# Patient Record
Sex: Female | Born: 1985
Health system: Southern US, Community
[De-identification: ages and names within clinical notes are randomized; demographics above are authoritative.]

## PROBLEM LIST (undated history)

## (undated) DIAGNOSIS — E559 Vitamin D deficiency, unspecified: Secondary | ICD-10-CM

## (undated) DIAGNOSIS — N61 Mastitis without abscess: Secondary | ICD-10-CM

## (undated) DIAGNOSIS — K649 Unspecified hemorrhoids: Secondary | ICD-10-CM

## (undated) DIAGNOSIS — Z789 Other specified health status: Secondary | ICD-10-CM

## (undated) DIAGNOSIS — O24419 Gestational diabetes mellitus in pregnancy, unspecified control: Secondary | ICD-10-CM

## (undated) HISTORY — DX: Vitamin D deficiency, unspecified: E55.9

## (undated) HISTORY — DX: Mastitis without abscess: N61.0

## (undated) HISTORY — PX: WISDOM TOOTH EXTRACTION: SHX21

## (undated) HISTORY — DX: Unspecified hemorrhoids: K64.9

## (undated) HISTORY — PX: ROOT CANAL: SHX2363

## (undated) HISTORY — DX: Gestational diabetes mellitus in pregnancy, unspecified control: O24.419

## (undated) HISTORY — PX: OTHER SURGICAL HISTORY: SHX169

---
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
## 2006-02-27 ENCOUNTER — Encounter: Payer: Self-pay | Admitting: Internal Medicine

## 2016-09-11 ENCOUNTER — Encounter: Payer: Self-pay | Admitting: Internal Medicine

## 2017-11-11 ENCOUNTER — Other Ambulatory Visit (INDEPENDENT_AMBULATORY_CARE_PROVIDER_SITE_OTHER): Payer: No Typology Code available for payment source

## 2017-11-11 DIAGNOSIS — Z3201 Encounter for pregnancy test, result positive: Secondary | ICD-10-CM

## 2017-11-11 LAB — POCT URINE PREGNANCY: Preg Test, Ur: POSITIVE — AB

## 2017-11-11 NOTE — Progress Notes (Signed)
Jordan Brown presents today for UPT. She {gen pregnancy complaints missed menstrual cycle and positive home pregnancy test. No unusual complaints of abdominal pain at this time.   LMP: 09/21/2017    OBJECTIVE: Appears well, in no apparent distress.  OB History   None    Home UPT Result: positive  In-Office UPT result: positive  I have reviewed the patient's medical, obstetrical, social, and family histories, and medications.   ASSESSMENT: Positive pregnancy test  PLAN Prenatal care to be completed at [facility]. Will see patient starting at 10-12 weeks of pregnancy.

## 2017-11-12 ENCOUNTER — Other Ambulatory Visit: Payer: Self-pay

## 2017-12-09 ENCOUNTER — Encounter: Payer: Self-pay | Admitting: Obstetrics & Gynecology

## 2017-12-09 ENCOUNTER — Telehealth: Payer: Self-pay | Admitting: Obstetrics & Gynecology

## 2017-12-09 DIAGNOSIS — Z5189 Encounter for other specified aftercare: Principal | ICD-10-CM

## 2017-12-09 DIAGNOSIS — O039 Complete or unspecified spontaneous abortion without complication: Secondary | ICD-10-CM

## 2017-12-09 NOTE — Telephone Encounter (Signed)
Faculty Practice OB/GYN Physician Phone Call Documentation  I received a call from Dr. [name], with report of recent miscarriage. She was asking about follow up studies such as ultrasound.  She reports passing tissue three days ago, continues to have period-like bleeding. Had cramping that lessened significantly after passage of the tissue. No other symptoms.  Patient was reassured that her story was consistent with likely completed miscarriage. Given her concerns about possible retained products, ultrasound was ordered, she will call to schedule this at [facility] where she works.  Her office new obstetric appointment scheduled on 12/19/17 was cancelled as per her request, she is up-to-date on pap smears and she will call to schedule follow up visit later (considering getting thrombophilia evaluation etc).  Patient was given adequate support, discussed availability of counselors, [facility] etc which she declined at this point. She reports having adequate support from her family.  She was told to call for any worsening symptoms or other gynecologic concerns.     Verita Schneiders, MD, [place] for [employer], [place]

## 2017-12-11 ENCOUNTER — Other Ambulatory Visit: Payer: Self-pay

## 2017-12-13 ENCOUNTER — Ambulatory Visit
Admission: RE | Admit: 2017-12-13 | Discharge: 2017-12-13 | Disposition: A | Discharge status: Home / Self Care | Payer: No Typology Code available for payment source | Source: Ambulatory Visit | Attending: Obstetrics & Gynecology | Admitting: Obstetrics & Gynecology

## 2017-12-13 DIAGNOSIS — Z5189 Encounter for other specified aftercare: Secondary | ICD-10-CM | POA: Insufficient documentation

## 2017-12-13 DIAGNOSIS — O039 Complete or unspecified spontaneous abortion without complication: Secondary | ICD-10-CM | POA: Diagnosis not present

## 2017-12-13 DIAGNOSIS — D259 Leiomyoma of uterus, unspecified: Secondary | ICD-10-CM | POA: Insufficient documentation

## 2017-12-19 ENCOUNTER — Encounter: Payer: Self-pay | Admitting: Obstetrics & Gynecology

## 2018-07-16 ENCOUNTER — Ambulatory Visit: Payer: No Typology Code available for payment source | Admitting: Internal Medicine

## 2018-07-17 ENCOUNTER — Ambulatory Visit (INDEPENDENT_AMBULATORY_CARE_PROVIDER_SITE_OTHER): Payer: No Typology Code available for payment source | Admitting: Obstetrics and Gynecology

## 2018-07-17 ENCOUNTER — Encounter: Payer: Self-pay | Admitting: Obstetrics and Gynecology

## 2018-07-17 ENCOUNTER — Other Ambulatory Visit: Payer: Self-pay

## 2018-07-17 DIAGNOSIS — Z319 Encounter for procreative management, unspecified: Secondary | ICD-10-CM

## 2018-07-17 DIAGNOSIS — Z9189 Other specified personal risk factors, not elsewhere classified: Secondary | ICD-10-CM

## 2018-07-17 NOTE — Progress Notes (Signed)
Patient reports have irregular bleeding for two months now. Patient does not want to be on birth control at this time.  LMP:07/12/2018  Last pap 2019- normal

## 2018-07-17 NOTE — Progress Notes (Signed)
Obstetrics and Gynecology New Patient Evaluation   TELEHEALTH VIRTUAL GYNECOLOGY VISIT ENCOUNTER NOTE  I connected with Jordan Brown on 07/17/18 at  1:15 PM EDT by telephone at home and verified that I am speaking with the correct person using two identifiers.   I discussed the limitations, risks, security and privacy concerns of performing an evaluation and management service by telephone and the availability of in person appointments. I also discussed with the patient that there may be a patient responsible charge related to this service. The patient expressed understanding and agreed to proceed.  Appointment Date: 07/18/2018  OBGYN Clinic: [facility]  Primary Care Provider: No primary care provider on file.  Referring Provider: Self  Chief Complaint:  Chief Complaint  Patient presents with  . Menstrual Problem    History of Present Illness: Jordan Brown is a 33 y.o. [place] G1P0010 (07/12/2018), seen for the above chief complaint. Her past medical history is significant for nothing.  Patient had an early SAB in September 2019 and since that time she's noted that her periods seem to be increasing in interval between (33-40 days). Her length of periods are still the same: 5-7 days, not particularly heavy or painful. Her LMP was 4/25 and her one prior was on 3/15 and used to be consistently approximately 30 days. Her and her husband are actively trying to concieve. She states she took an OPK and it showed an [place] surge on 4/8; that was the only time she's used an OPK. She has taken home UPTs and they have been negative. She is also using an app for timed intercourse.   Her husband is 43 y/o, doesn't drink or smoke, has no medical or surgical history, only takes finasteride for alopecia and has no children.   Review of Systems: as noted in the History of Present Illness.   Past Medical History:  History reviewed. No pertinent past medical  history.  Past Surgical History:  History reviewed. No pertinent surgical history.  Past Obstetrical History:  OB History  Gravida Para Term Preterm AB Living  1       1 0  SAB TAB Ectopic Multiple Live Births  1       0    # Outcome Date GA Lbr Len/2nd Weight Sex Delivery Anes PTL Lv  1 SAB             Past Gynecological History: As per HPI. History of Pap Smear(s): Yes.   Last pap 2018, which was negative and no h/o abnormal paps  Social History:  Social History   Socioeconomic History  . Marital status: Married    Spouse name: Not on file  . Number of children: Not on file  . Years of education: Not on file  . Highest education level: Not on file  Occupational History  . Not on file  Social Needs  . Financial resource strain: Not on file  . Food insecurity:    Worry: Not on file    Inability: Not on file  . Transportation needs:    Medical: Not on file    Non-medical: Not on file  Tobacco Use  . Smoking status: Not on file  Substance and Sexual Activity  . Alcohol use: Never    Frequency: Never  . Drug use: Never  . Sexual activity: Not on file  Lifestyle  . Physical activity:    Days per week: Not on file    Minutes per session: Not on file  .  Stress: Not on file  Relationships  . Social connections:    Talks on phone: Not on file    Gets together: Not on file    Attends religious service: Not on file    Active member of club or organization: Not on file    Attends meetings of clubs or organizations: Not on file    Relationship status: Not on file  . Intimate partner violence:    Fear of current or ex partner: Not on file    Emotionally abused: Not on file    Physically abused: Not on file    Forced sexual activity: Not on file  Other Topics Concern  . Not on file  Social History Narrative   Patient is a GI physician     Family History: She denies any bleeding or blood clotting disorders.    Medications Jordan Brown had no medications  administered during this visit. No current outpatient medications on file.   No current facility-administered medications for this visit.     Allergies Patient has no allergy information on record.   Physical Exam:  There were no vitals taken for this visit. There is no height or weight on file to calculate BMI. General appearance: Well nourished, well developed female in no acute distress.  Neuro/Psych:  Normal mood and affect.   Laboratory: none  Radiology:  CLINICAL DATA:  Followup after miscarriage.  EXAM: OBSTETRIC <14 WK [place] AND TRANSVAGINAL OB US  TECHNIQUE: Both transabdominal and transvaginal ultrasound examinations were performed for complete evaluation of the gestation as well as the maternal uterus, adnexal regions, and pelvic cul-de-sac. Transvaginal technique was performed to assess early pregnancy.  COMPARISON:  None.  FINDINGS: Intrauterine gestational sac: None  Yolk sac:  None  Embryo:  None  Cardiac Activity: None  Heart Rate: Absent bpm  Subchorionic hemorrhage:  None applicable  Maternal uterus/adnexae:  Uterus: 6.5 x 4.8 x 5.8 centimeters. Posterior fibroid is 2.3 x 1.8 x 1.8 centimeters. Anterior fibroid is 0.7 x 0.8 x 0.7 centimeters.  Endometrium: 9.7 millimeters. Minimally heterogeneous with no evidence for retained products of conception.  RIGHT ovary: 4.0 x 1.6 x 2.9 centimeters.  Normal in appearance.  LEFT ovary: 4.0 x 2.2 x 2.8 centimeters. Small corpus luteum cyst is 2.2 centimeters.  IMPRESSION: 1. No evidence for intrauterine or adnexal pregnancy. 2. Normal appearance of the endometrial stripe, 9.7 millimeters in thickness. 3. Uterine fibroids, largest measuring 2.3 centimeters.   Electronically Signed   By: Nolon Nations M.D.   On: 12/13/2017 15:21  Assessment: pt stable  Plan:  *Infertility Counseling  I told her that given her age and history of trying for several months that I recommend  evaluation by REI. I also told her that luteal phase of a cycle is constant at 14 days and it's the follicular phase that can vary and causes the different lengths in a cycle. I told her that typically you would do intercourse every other day for a week starting on cycle day 10 but that can be difficult to time with such varying lengths of her cycle. I told her that I recommend counting 10 days from the 1st day of regular flow and having intercourse every other day. I also told her to continue to use the OPK and to use this time when they can stop having timed intercourse, b/c she should expect to see ovulation within about 24-72 hours of the +OPK results. I told her that it is reassuring that the prior  OPK test results showed that she is ovulating. I also told her that I'm not aware of any negative effects, in terms of fertility, with finasteride  I also told her that we usually get a cycle day 3 baseline u/s but that can be done with REI since nothing impressive on her prior u/s and they will likely want to do an HSG. I reviewed the images and the fibroids appear to be intramural and that fibroids that small would only really impact her fertility if they were submucosal but even that usually causes issues with recurrent SAB and not getting pregnant.   Recommendations: 1) Refer to REI. Pt amenable to this and will left [place] know where she'd like to be referred; she lives in [place] 2) I recommend her husband get a semen analysis at a fertility center ([facility] in [place], [place]) 3) Recommend getting cycle day 3 LH, FSH, estradiol, AMH levels. I will also do a TSH and RPR 4) take a women's once a day or folic acid daily 5) continue with timed intercourse, OPK use. Recommend increasing timed intercourse length.   RTC PRN  I provided 30 minutes of non-face-to-face time during this encounter. The visit was done via a Webex visit.   Durene Romans MD Attending Center for [employer])

## 2018-07-18 ENCOUNTER — Encounter: Payer: Self-pay | Admitting: Obstetrics and Gynecology

## 2018-07-18 DIAGNOSIS — Z319 Encounter for procreative management, unspecified: Secondary | ICD-10-CM | POA: Insufficient documentation

## 2018-07-28 ENCOUNTER — Ambulatory Visit: Payer: No Typology Code available for payment source | Admitting: Obstetrics & Gynecology

## 2018-10-06 ENCOUNTER — Encounter: Payer: No Typology Code available for payment source | Admitting: Obstetrics & Gynecology

## 2018-10-09 ENCOUNTER — Other Ambulatory Visit: Payer: Self-pay

## 2018-10-09 ENCOUNTER — Encounter: Payer: Self-pay | Admitting: Obstetrics & Gynecology

## 2018-10-09 ENCOUNTER — Ambulatory Visit (INDEPENDENT_AMBULATORY_CARE_PROVIDER_SITE_OTHER): Payer: No Typology Code available for payment source | Admitting: Obstetrics & Gynecology

## 2018-10-09 ENCOUNTER — Encounter: Payer: No Typology Code available for payment source | Admitting: Obstetrics & Gynecology

## 2018-10-09 VITALS — BP 125/75 | HR 82 | Ht 65.0 in | Wt 130.0 lb

## 2018-10-09 DIAGNOSIS — Z3A12 12 weeks gestation of pregnancy: Secondary | ICD-10-CM

## 2018-10-09 DIAGNOSIS — Z3689 Encounter for other specified antenatal screening: Secondary | ICD-10-CM

## 2018-10-09 DIAGNOSIS — Z113 Encounter for screening for infections with a predominantly sexual mode of transmission: Secondary | ICD-10-CM | POA: Diagnosis not present

## 2018-10-09 DIAGNOSIS — Z3490 Encounter for supervision of normal pregnancy, unspecified, unspecified trimester: Secondary | ICD-10-CM | POA: Insufficient documentation

## 2018-10-09 DIAGNOSIS — Z3401 Encounter for supervision of normal first pregnancy, first trimester: Secondary | ICD-10-CM

## 2018-10-09 DIAGNOSIS — O0990 Supervision of high risk pregnancy, unspecified, unspecified trimester: Secondary | ICD-10-CM | POA: Insufficient documentation

## 2018-10-09 NOTE — Progress Notes (Signed)
History:   Jordan Brown is a 33 y.o. G2P0010 at [redacted]w[redacted]d by LMP, early ultrasound being seen today for her first obstetrical visit.  Her obstetrical history is significant for history of recent miscarriage in 11/2017 and some infertility issues. Accompanied by her husband. Patient does intend to breast feed. Pregnancy history fully reviewed.  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  Patient reports no complaints.      HISTORY: OB History  Gravida Para Term Preterm AB Living  2 0 0 0 1 0  SAB TAB Ectopic Multiple Live Births  1 0 0 0 0    # Outcome Date GA Lbr Len/2nd Weight Sex Delivery Anes PTL Lv  2 Current           1 SAB             Last pap smear was done 2018 and was normal  Past Medical History:  Diagnosis Date  . Medical history non-contributory    Past Surgical History:  Procedure Laterality Date  . NO PAST SURGERIES     History reviewed. No pertinent family history. Social History   Tobacco Use  . Smoking status: Never Smoker  . Smokeless tobacco: Never Used  Substance Use Topics  . Alcohol use: Never    Frequency: Never  . Drug use: Never   No Known Allergies Current Outpatient Medications on File Prior to Visit  Medication Sig Dispense Refill  . prenatal vitamin w/FE, FA (PRENATAL 1 + 1) 27-1 MG TABS tablet Take 1 tablet by mouth daily at 12 noon.     No current facility-administered medications on file prior to visit.     Review of Systems Pertinent items noted in HPI and remainder of comprehensive ROS otherwise negative. Physical Exam:   Vitals:   10/09/18 1519 10/09/18 1520  BP: 125/75   Pulse: 82   Weight: 130 lb (59 kg)   Height:  5\' 5"  (1.651 m)   Fetal Heart Rate (bpm): 148bpm**Bedside Ultrasound for FHR check: Patient informed that the ultrasound is considered a limited obstetric ultrasound and is not intended to be a complete ultrasound exam.  Patient also informed that the ultrasound is not being completed with the intent of  assessing for fetal or placental anomalies or any pelvic abnormalities.  Explained that the purpose of today's ultrasound is to assess for fetal heart rate.  Patient acknowledges the purpose of the exam and the limitations of the study. General: well-developed, well-nourished female in no acute distress  Breasts:  normal appearance, no masses or tenderness bilaterally  Skin: normal coloration and turgor, no rashes  Neurologic: oriented, normal, negative, normal mood  Extremities: normal strength, tone, and muscle mass, ROM of all joints is normal  HEENT PERRLA, extraocular movement intact and sclera clear, anicteric  Mouth/Teeth mucous membranes moist, pharynx normal without lesions and dental hygiene good  Neck supple and no masses  Cardiovascular: regular rate and rhythm  Respiratory:  no respiratory distress, normal breath sounds  Abdomen: soft, non-tender; bowel sounds normal; no masses,  no organomegaly  Pelvic: deferred     Assessment:    Pregnancy: G2P0010 Patient Active Problem List   Diagnosis Date Noted  . Supervision of normal pregnancy 10/09/2018     Plan:    1. Encounter for supervision of normal first pregnancy in first trimester 2. Encounter for fetal anatomic survey - Obstetric Panel, Including HIV - Genetic Screening - Culture, OB Urine - GC/Chlamydia probe amp ([place])not at [facility] -  Enroll Patient in Babyscripts - Babyscripts Schedule Optimization - Urinalysis, Routine w reflex microscopic - Comprehensive metabolic panel - TSH - [place] MFM OB COMP + 14 WK; Future Initial labs drawn. Continue prenatal vitamins. Genetic Screening discussed, NIPS: ordered. Ultrasound discussed; fetal anatomic survey: ordered. Problem list reviewed and updated. The nature of [place] with multiple MDs and other Advanced Practice Providers was explained to patient; also emphasized that residents, students are part of our team. Routine  obstetric precautions reviewed. Return in about 4 weeks (around 11/06/2018) for Virtual OB Visit.     Verita Schneiders, MD, [place] for [employer], [place]

## 2018-10-09 NOTE — Patient Instructions (Signed)
First Trimester of Pregnancy The first trimester of pregnancy is from week 1 until the end of week 13 (months 1 through 3). A week after a sperm fertilizes an egg, the egg will implant on the wall of the uterus. This embryo will begin to develop into a baby. Genes from you and your partner will form the baby. The female genes will determine whether the baby will be a boy or a girl. At 6-8 weeks, the eyes and face will be formed, and the heartbeat can be seen on ultrasound. At the end of 12 weeks, all the baby's organs will be formed. Now that you are pregnant, you will want to do everything you can to have a healthy baby. Two of the most important things are to get good prenatal care and to follow your health care provider's instructions. Prenatal care is all the medical care you receive before the baby's birth. This care will help prevent, find, and treat any problems during the pregnancy and childbirth. Body changes during your first trimester Your body goes through many changes during pregnancy. The changes vary from woman to woman.  You may gain or lose a couple of pounds at first.  You may feel sick to your stomach (nauseous) and you may throw up (vomit). If the vomiting is uncontrollable, call your health care provider.  You may tire easily.  You may develop headaches that can be relieved by medicines. All medicines should be approved by your health care provider.  You may urinate more often. Painful urination may mean you have a bladder infection.  You may develop heartburn as a result of your pregnancy.  You may develop constipation because certain hormones are causing the muscles that push stool through your intestines to slow down.  You may develop hemorrhoids or swollen veins (varicose veins).  Your breasts may begin to grow larger and become tender. Your nipples may stick out more, and the tissue that surrounds them ([place]ola) may become darker.  Your gums may bleed and may be  sensitive to brushing and flossing.  Dark spots or blotches (chloasma, mask of pregnancy) may develop on your face. This will likely fade after the baby is born.  Your menstrual periods will stop.  You may have a loss of appetite.  You may develop cravings for certain kinds of food.  You may have changes in your emotions from day to day, such as being excited to be pregnant or being concerned that something may go wrong with the pregnancy and baby.  You may have more vivid and strange dreams.  You may have changes in your hair. These can include thickening of your hair, rapid growth, and changes in texture. Some women also have hair loss during or after pregnancy, or hair that feels dry or thin. Your hair will most likely return to normal after your baby is born. What to expect at prenatal visits During a routine prenatal visit:  You will be weighed to make sure you and the baby are growing normally.  Your blood pressure will be taken.  Your abdomen will be measured to track your baby's growth.  The fetal heartbeat will be listened to between weeks 10 and 14 of your pregnancy.  Test results from any previous visits will be discussed. Your health care provider may ask you:  How you are feeling.  If you are feeling the baby move.  If you have had any abnormal symptoms, such as leaking fluid, bleeding, severe headaches, or abdominal   cramping.  If you are using any tobacco products, including cigarettes, chewing tobacco, and electronic cigarettes.  If you have any questions. Other tests that may be performed during your first trimester include:  Blood tests to find your blood type and to check for the presence of any previous infections. The tests will also be used to check for low iron levels (anemia) and protein on red blood cells (Rh antibodies). Depending on your risk factors, or if you previously had diabetes during pregnancy, you may have tests to check for high blood sugar  that affects pregnant women (gestational diabetes).  Urine tests to check for infections, diabetes, or protein in the urine.  An ultrasound to confirm the proper growth and development of the baby.  Fetal screens for spinal cord problems (spina bifida) and Down syndrome.  HIV (human immunodeficiency virus) testing. Routine prenatal testing includes screening for HIV, unless you choose not to have this test.  You may need other tests to make sure you and the baby are doing well. Follow these instructions at home: Medicines  Follow your health care provider's instructions regarding medicine use. Specific medicines may be either safe or unsafe to take during pregnancy.  Take a prenatal vitamin that contains at least 600 micrograms (mcg) of folic acid.  If you develop constipation, try taking a stool softener if your health care provider approves. Eating and drinking   Eat a balanced diet that includes fresh fruits and vegetables, whole grains, good sources of protein such as meat, eggs, or tofu, and low-fat dairy. Your health care provider will help you determine the amount of weight gain that is right for you.  Avoid raw meat and uncooked cheese. These carry germs that can cause birth defects in the baby.  Eating four or five small meals rather than three large meals a day may help relieve nausea and vomiting. If you start to feel nauseous, eating a few soda crackers can be helpful. Drinking liquids between meals, instead of during meals, also seems to help ease nausea and vomiting.  Limit foods that are high in fat and processed sugars, such as fried and sweet foods.  To prevent constipation: ? Eat foods that are high in fiber, such as fresh fruits and vegetables, whole grains, and beans. ? Drink enough fluid to keep your urine clear or pale yellow. Activity  Exercise only as directed by your health care provider. Most women can continue their usual exercise routine during  pregnancy. Try to exercise for 30 minutes at least 5 days a week. Exercising will help you: ? Control your weight. ? Stay in shape. ? Be prepared for labor and delivery.  Experiencing pain or cramping in the lower abdomen or lower back is a good sign that you should stop exercising. Check with your health care provider before continuing with normal exercises.  Try to avoid standing for long periods of time. Move your legs often if you must stand in one place for a long time.  Avoid heavy lifting.  Wear low-heeled shoes and practice good posture.  You may continue to have sex unless your health care provider tells you not to. Relieving pain and discomfort  Wear a good support bra to relieve breast tenderness.  Take warm sitz baths to soothe any pain or discomfort caused by hemorrhoids. Use hemorrhoid cream if your health care provider approves.  Rest with your legs elevated if you have leg cramps or low back pain.  If you develop varicose veins in   your legs, wear support hose. Elevate your feet for 15 minutes, 3-4 times a day. Limit salt in your diet. Prenatal care  Schedule your prenatal visits by the twelfth week of pregnancy. They are usually scheduled monthly at first, then more often in the last 2 months before delivery.  Write down your questions. Take them to your prenatal visits.  Keep all your prenatal visits as told by your health care provider. This is important. Safety  Wear your seat belt at all times when driving.  Make a list of emergency phone numbers, including numbers for family, friends, the hospital, and police and fire departments. General instructions  Ask your health care provider for a referral to a local prenatal education class. Begin classes no later than the beginning of month 6 of your pregnancy.  Ask for help if you have counseling or nutritional needs during pregnancy. Your health care provider can offer advice or refer you to specialists for help  with various needs.  Do not use hot tubs, steam rooms, or saunas.  Do not douche or use tampons or scented sanitary pads.  Do not cross your legs for long periods of time.  Avoid cat litter boxes and soil used by cats. These carry germs that can cause birth defects in the baby and possibly loss of the fetus by miscarriage or stillbirth.  Avoid all smoking, herbs, alcohol, and medicines not prescribed by your health care provider. Chemicals in these products affect the formation and growth of the baby.  Do not use any products that contain nicotine or tobacco, such as cigarettes and e-cigarettes. If you need help quitting, ask your health care provider. You may receive counseling support and other resources to help you quit.  Schedule a dentist appointment. At home, brush your teeth with a soft toothbrush and be gentle when you floss. Contact a health care provider if:  You have dizziness.  You have mild pelvic cramps, pelvic pressure, or nagging pain in the abdominal area.  You have persistent nausea, vomiting, or diarrhea.  You have a bad smelling vaginal discharge.  You have pain when you urinate.  You notice increased swelling in your face, hands, legs, or ankles.  You are exposed to fifth disease or chickenpox.  You are exposed to German measles (rubella) and have never had it. Get help right away if:  You have a fever.  You are leaking fluid from your vagina.  You have spotting or bleeding from your vagina.  You have severe abdominal cramping or pain.  You have rapid weight gain or loss.  You vomit blood or material that looks like coffee grounds.  You develop a severe headache.  You have shortness of breath.  You have any kind of trauma, such as from a fall or a car accident. Summary  The first trimester of pregnancy is from week 1 until the end of week 13 (months 1 through 3).  Your body goes through many changes during pregnancy. The changes vary from  woman to woman.  You will have routine prenatal visits. During those visits, your health care provider will examine you, discuss any test results you may have, and talk with you about how you are feeling. This information is not intended to replace advice given to you by your health care provider. Make sure you discuss any questions you have with your health care provider. Document Released: 02/27/2001 Document Revised: 02/15/2017 Document Reviewed: 02/15/2016 Elsevier Patient Education  2020 [employer]  

## 2018-10-10 LAB — COMPREHENSIVE METABOLIC PANEL
ALT: 18 IU/L (ref 0–32)
AST: 19 IU/L (ref 0–40)
Albumin/Globulin Ratio: 2 (ref 1.2–2.2)
Albumin: 4.8 g/dL (ref 3.8–4.8)
Alkaline Phosphatase: 33 IU/L — ABNORMAL LOW (ref 39–117)
BUN/Creatinine Ratio: 15 (ref 9–23)
BUN: 9 mg/dL (ref 6–20)
Bilirubin Total: <0.2 mg/dL (ref 0.0–1.2)
CO2: 22 mmol/L (ref 20–29)
Calcium: 10 mg/dL (ref 8.7–10.2)
Chloride: 99 mmol/L (ref 96–106)
Creatinine, Ser: 0.62 mg/dL (ref 0.57–1.00)
GFR calc Af Amer: 138 mL/min/{1.73_m2} (ref >59)
GFR calc non Af Amer: 120 mL/min/{1.73_m2} (ref >59)
Globulin, Total: 2.4 g/dL (ref 1.5–4.5)
Glucose: 72 mg/dL (ref 65–99)
Potassium: 4.3 mmol/L (ref 3.5–5.2)
Sodium: 136 mmol/L (ref 134–144)
Total Protein: 7.2 g/dL (ref 6.0–8.5)

## 2018-10-10 LAB — OBSTETRIC PANEL, INCLUDING HIV
Antibody Screen: NEGATIVE
Basophils Absolute: 0 10*3/uL (ref 0.0–0.2)
Basos: 0 %
EOS (ABSOLUTE): 0.1 10*3/uL (ref 0.0–0.4)
Eos: 1 %
HIV Screen 4th Generation wRfx: NONREACTIVE
Hematocrit: 37.3 % (ref 34.0–46.6)
Hemoglobin: 12.4 g/dL (ref 11.1–15.9)
Hepatitis B Surface Ag: NEGATIVE
Immature Grans (Abs): 0 10*3/uL (ref 0.0–0.1)
Immature Granulocytes: 0 %
Lymphocytes Absolute: 2.3 10*3/uL (ref 0.7–3.1)
Lymphs: 28 %
MCH: 30 pg (ref 26.6–33.0)
MCHC: 33.2 g/dL (ref 31.5–35.7)
MCV: 90 fL (ref 79–97)
Monocytes Absolute: 0.4 10*3/uL (ref 0.1–0.9)
Monocytes: 5 %
Neutrophils Absolute: 5.4 10*3/uL (ref 1.4–7.0)
Neutrophils: 66 %
Platelets: 210 10*3/uL (ref 150–450)
RBC: 4.13 x10E6/uL (ref 3.77–5.28)
RDW: 12.2 % (ref 11.7–15.4)
RPR Ser Ql: NONREACTIVE
Rh Factor: POSITIVE
Rubella Antibodies, IGG: 4.33 index (ref >0.99)
WBC: 8.2 10*3/uL (ref 3.4–10.8)

## 2018-10-10 LAB — URINALYSIS, ROUTINE W REFLEX MICROSCOPIC
Bilirubin, UA: NEGATIVE
Glucose, UA: NEGATIVE
Ketones, UA: NEGATIVE
Leukocytes,UA: NEGATIVE
Nitrite, UA: NEGATIVE
Protein,UA: NEGATIVE
RBC, UA: NEGATIVE
Specific Gravity, UA: 1.01 (ref 1.005–1.030)
Urobilinogen, Ur: 0.2 mg/dL (ref 0.2–1.0)
pH, UA: 6 (ref 5.0–7.5)

## 2018-10-10 LAB — TSH: TSH: 1.14 u[IU]/mL (ref 0.450–4.500)

## 2018-10-11 LAB — GC/CHLAMYDIA PROBE AMP (~~LOC~~) NOT AT ARMC
Chlamydia: NEGATIVE
Neisseria Gonorrhea: NEGATIVE

## 2018-10-12 LAB — URINE CULTURE, OB REFLEX

## 2018-10-12 LAB — CULTURE, OB URINE

## 2018-10-13 ENCOUNTER — Encounter: Payer: Self-pay | Admitting: Obstetrics & Gynecology

## 2018-10-13 DIAGNOSIS — O99820 Streptococcus B carrier state complicating pregnancy: Secondary | ICD-10-CM | POA: Insufficient documentation

## 2018-10-15 ENCOUNTER — Other Ambulatory Visit: Payer: Self-pay

## 2018-10-15 ENCOUNTER — Telehealth: Payer: Self-pay | Admitting: *Deleted

## 2018-10-15 ENCOUNTER — Other Ambulatory Visit: Payer: No Typology Code available for payment source

## 2018-10-15 ENCOUNTER — Encounter: Payer: Self-pay | Admitting: *Deleted

## 2018-10-15 NOTE — Telephone Encounter (Signed)
Pt informed of panorama results  ?

## 2018-10-29 ENCOUNTER — Telehealth: Payer: Self-pay | Admitting: *Deleted

## 2018-10-29 ENCOUNTER — Encounter: Payer: Self-pay | Admitting: *Deleted

## 2018-10-29 NOTE — Telephone Encounter (Signed)
Pt informed of carrier screen results.

## 2018-11-06 ENCOUNTER — Telehealth: Payer: No Typology Code available for payment source | Admitting: Family Medicine

## 2018-11-13 ENCOUNTER — Telehealth: Payer: No Typology Code available for payment source | Admitting: Family Medicine

## 2018-11-28 ENCOUNTER — Other Ambulatory Visit: Payer: Self-pay

## 2018-11-28 ENCOUNTER — Ambulatory Visit (HOSPITAL_COMMUNITY)
Admission: RE | Admit: 2018-11-28 | Discharge: 2018-11-28 | Disposition: A | Discharge status: Home / Self Care | Payer: No Typology Code available for payment source | Source: Ambulatory Visit | Attending: Obstetrics and Gynecology | Admitting: Obstetrics and Gynecology

## 2018-11-28 DIAGNOSIS — Z363 Encounter for antenatal screening for malformations: Secondary | ICD-10-CM

## 2018-11-28 DIAGNOSIS — Z3689 Encounter for other specified antenatal screening: Secondary | ICD-10-CM

## 2018-11-28 DIAGNOSIS — Z3401 Encounter for supervision of normal first pregnancy, first trimester: Secondary | ICD-10-CM

## 2018-11-28 DIAGNOSIS — Z3A19 19 weeks gestation of pregnancy: Secondary | ICD-10-CM | POA: Diagnosis not present

## 2018-11-29 ENCOUNTER — Encounter: Payer: Self-pay | Admitting: Obstetrics & Gynecology

## 2018-11-29 DIAGNOSIS — O358XX0 Maternal care for other (suspected) fetal abnormality and damage, not applicable or unspecified: Secondary | ICD-10-CM | POA: Insufficient documentation

## 2018-11-29 DIAGNOSIS — O35EXX0 Maternal care for other (suspected) fetal abnormality and damage, fetal genitourinary anomalies, not applicable or unspecified: Secondary | ICD-10-CM | POA: Insufficient documentation

## 2018-12-01 ENCOUNTER — Telehealth (INDEPENDENT_AMBULATORY_CARE_PROVIDER_SITE_OTHER): Payer: No Typology Code available for payment source | Admitting: Obstetrics & Gynecology

## 2018-12-01 ENCOUNTER — Other Ambulatory Visit (HOSPITAL_COMMUNITY): Payer: Self-pay | Admitting: *Deleted

## 2018-12-01 ENCOUNTER — Telehealth: Payer: No Typology Code available for payment source | Admitting: Obstetrics & Gynecology

## 2018-12-01 ENCOUNTER — Other Ambulatory Visit: Payer: Self-pay

## 2018-12-01 DIAGNOSIS — O358XX0 Maternal care for other (suspected) fetal abnormality and damage, not applicable or unspecified: Secondary | ICD-10-CM

## 2018-12-01 DIAGNOSIS — Z3400 Encounter for supervision of normal first pregnancy, unspecified trimester: Secondary | ICD-10-CM

## 2018-12-01 DIAGNOSIS — O99820 Streptococcus B carrier state complicating pregnancy: Secondary | ICD-10-CM

## 2018-12-01 DIAGNOSIS — O35EXX0 Maternal care for other (suspected) fetal abnormality and damage, fetal genitourinary anomalies, not applicable or unspecified: Secondary | ICD-10-CM

## 2018-12-01 DIAGNOSIS — Z3A20 20 weeks gestation of pregnancy: Secondary | ICD-10-CM

## 2018-12-01 NOTE — Progress Notes (Signed)
I connected with  Jordan Brown on 12/01/18 at  4:15 PM EDT by telephone and verified that I am speaking with the correct person using two identifiers.   I discussed the limitations, risks, security and privacy concerns of performing an evaluation and management service by telephone and the availability of in person appointments. I also discussed with the patient that there may be a patient responsible charge related to this service. The patient expressed understanding and agreed to proceed.  Crosby Oyster, RN 12/01/2018  4:17 PM

## 2018-12-01 NOTE — Progress Notes (Signed)
TELEHEALTH OBSTETRICS PRENATAL VIRTUAL VIDEO VISIT ENCOUNTER NOTE  Provider location: [facility] [place] [facility]   I connected with Jordan Brown on 12/01/18 at  4:15 PM EDT by MyChart Video Encounter at home and verified that I am speaking with the correct person using two identifiers.   I discussed the limitations, risks, security and privacy concerns of performing an evaluation and management service virtually and the availability of in person appointments. I also discussed with the patient that there may be a patient responsible charge related to this service. The patient expressed understanding and agreed to proceed. Subjective:  Jordan Brown is a 33 y.o. G2P0010 at [redacted]w[redacted]d being seen today for ongoing prenatal care.  She is currently monitored for the following issues for this low-risk pregnancy and has Supervision of normal pregnancy; Group B streptococcal carriage in urine complicating pregnancy; and Bilateral mild fetal urinary tract dilation, antepartum on their problem list.  Patient reports no complaints.  Contractions: Not present. Vag. Bleeding: None.   . Denies any leaking of fluid.   The following portions of the patient's history were reviewed and updated as appropriate: allergies, current medications, past family history, past medical history, past social history, past surgical history and problem list.   Objective:  There were no vitals filed for this visit.  Fetal Status:           General:  Alert, oriented and cooperative. Patient is in no acute distress.  Respiratory: Normal respiratory effort, no problems with respiration noted  Mental Status: Normal mood and affect. Normal behavior. Normal judgment and thought content.  Rest of physical exam deferred due to type of encounter  Imaging: [place] Mfm Ob Comp + 14 Wk  Result Date: 11/28/2018 ----------------------------------------------------------------------  OBSTETRICS REPORT                        (Signed Final 11/28/2018 05:54 pm) ---------------------------------------------------------------------- Patient Info  ID #:       [id number]                          D.O.B.:  02-19-1986 (33 yrs)  Name:       Jordan Brown               Visit Date: 11/28/2018 03:37 pm ---------------------------------------------------------------------- Performed By  Performed By:     Enriqueta Shutter           Ref. Address:     [address] [place], [place]  [place]  Attending:        Tama High MD        [facility]  Referred By:      Osborne Oman MD ---------------------------------------------------------------------- Orders   #  Description                          Code         Ordered By   1  [place] MFM OB COMP + 28 WK               [id number]     Verita Schneiders  ----------------------------------------------------------------------   #  Order #                    Accession #                 Episode #   1  [id number]                  [id number]                  [id number]  ---------------------------------------------------------------------- Indications   Encounter for antenatal screening for          Z36.3   malformations   [redacted] weeks gestation of pregnancy                Z3A.19  ---------------------------------------------------------------------- Fetal Evaluation  Num Of Fetuses:         1  Fetal Heart Rate(bpm):  149  Cardiac Activity:       Observed  Presentation:           Breech  Placenta:               Anterior  P. Cord Insertion:      Visualized  Amniotic Fluid  AFI FV:      Within normal limits                              Largest Pocket(cm)                              4.8  ---------------------------------------------------------------------- Biometry  BPD:      45.2  mm     G. Age:  19w 5d         42  %    CI:        69.68   %    70 - 86                                                          FL/HC:      17.2   %  16.8 - 19.8  HC:      172.8  mm     G. Age:  19w 6d         41  %    HC/AC:      1.13        1.09 - 1.39  AC:      152.6  mm     G. Age:  20w 3d         65  %    FL/BPD:     65.7   %  FL:       29.7  mm     G. Age:  19w 1d         19  %    FL/AC:      19.5   %    20 - 24  HUM:      30.5  mm     G. Age:  20w 1d         59  %  CER:      20.4  mm     G. Age:  19w 3d         68  %  NFT:       3.6  mm  LV:        7.6  mm  CM:        3.1  mm  Est. FW:     317  gm    0 lb 11 oz      45  % ---------------------------------------------------------------------- OB History  Gravidity:    2         Term:   0        Prem:   0        SAB:   1  TOP:          0       Ectopic:  0        Living: 0 ---------------------------------------------------------------------- Gestational Age  LMP:           19w 6d        Date:  07/12/18                 EDD:   04/18/19  U/S Today:     19w 6d                                        EDD:   04/18/19  Best:          19w 6d     Det. By:  LMP  (07/12/18)          EDD:   04/18/19 ---------------------------------------------------------------------- Anatomy  Cranium:               Appears normal         LVOT:                   Appears normal  Cavum:                 Appears normal         Aortic Arch:            Not well visualized  Ventricles:            Appears normal         Ductal Arch:  Not well visualized  Choroid Plexus:        Appears normal         Diaphragm:              Appears normal  Cerebellum:            Appears normal         Stomach:                Appears normal, left                                                                        sided  Posterior Fossa:       Appears normal         Abdomen:                Appears normal   Nuchal Fold:           Appears normal         Abdominal Wall:         Appears nml (cord                                                                        insert, abd wall)  Face:                  Appears normal         Cord Vessels:           Appears normal (3                         (orbits and profile)                           vessel cord)  Lips:                  Appears normal         Kidneys:                Bilat UTD, Rt 37mm,                                                                        Lt 76mm  Palate:                Appears normal         Bladder:                Appears normal  Thoracic:              Appears normal         Spine:  Appears normal  Heart:                 Appears normal         Upper Extremities:      Appears normal                         (4CH, axis, and                         situs)  RVOT:                  Appears normal         Lower Extremities:      Appears normal  Other:  Nasal bone visualized. Fetus appears to be female. Open hands          visualized. 5th digit visualized. Heels visualized. ---------------------------------------------------------------------- Cervix Uterus Adnexa  Cervix  Length:           6.02  cm.  Normal appearance by transabdominal scan.  Left Ovary  Within normal limits.  Right Ovary  Not visualized. No adnexal mass visualized. ---------------------------------------------------------------------- Impression  Ms. Kosta G2, P0 is here for fetal anatomy scan.  On cell  free fetal DNA screening the risks of fetal aneuploidies are  not increased.  Patient reports no chronic medical conditions.  We performed fetal anatomical survey.  Fetal biometry is  consistent with her previously established dates.  Amniotic  fluid is normal and good fetal activity seen.  Bilateral mild  urinary tract dilations are seen (measuring 5 to 6 mm each).  No other markers of aneuploidies or fetal structural defects  are seen.  I explained the finding of UTD  that most cases resolve on  your low or after birth and only rarely associated with  obstructive uropathy.  UTD is also a marker for Down  syndrome.  Given that she had low risk for Down syndrome  on cell free fetal DNA screening this should be considered a  normal variant.  I discussed the significance and limitations of cell free fetal  DNA screening and informed her that only amniocentesis will  give a definitive result on the fetal karyotype.  Patient understands the limitations of ultrasound in detecting  fetal anomalies and opted not to have amniocentesis. ---------------------------------------------------------------------- Recommendations  -An appointment was made for her to return in 8 weeks for  fetal growth and renal assessments. ----------------------------------------------------------------------                  Tama High, MD Electronically Signed Final Report   11/28/2018 05:54 pm ----------------------------------------------------------------------   Assessment and Plan:  Pregnancy: G2P0010 at [redacted]w[redacted]d 1. Supervision of normal first pregnancy, antepartum Not feeling movement yet. Needs AFP this week    2. Group B streptococcal carriage in urine complicating pregnancy Needs treatment in labor .  3. Pregnancy affected by genitourinary abnormality of fetus, single or unspecified fetus Has repeat US ordered for 8 week    Preterm labor symptoms and general obstetric precautions including but not limited to vaginal bleeding, contractions, leaking of fluid and fetal movement were reviewed in detail with the patient. I discussed the assessment and treatment plan with the patient. The patient was provided an opportunity to ask questions and all were answered. The patient agreed with the plan and demonstrated an understanding of the instructions. The patient was advised to call back or seek an in-person office evaluation/go to [facility]  at [facility] for any urgent or concerning  symptoms. Please refer to After Visit Summary for other counseling recommendations.   I provided 15 minutes of face-to-face time during this encounter.  No follow-ups on file.  Future Appointments  Date Time Provider [place]  12/31/2018  3:00 PM McLean-Scocuzza, Nino Glow, MD [facility]  01/16/2019  1:40 PM [place] NURSE [place] [facility]  01/16/2019  1:45 PM [place] 2 [facility]    Lavonia Drafts, [place] for [facility], [place]

## 2018-12-02 ENCOUNTER — Other Ambulatory Visit: Payer: No Typology Code available for payment source

## 2018-12-02 ENCOUNTER — Other Ambulatory Visit: Payer: Self-pay

## 2018-12-02 DIAGNOSIS — Z3400 Encounter for supervision of normal first pregnancy, unspecified trimester: Secondary | ICD-10-CM

## 2018-12-02 DIAGNOSIS — Z9189 Other specified personal risk factors, not elsewhere classified: Secondary | ICD-10-CM

## 2018-12-02 DIAGNOSIS — Z319 Encounter for procreative management, unspecified: Secondary | ICD-10-CM

## 2018-12-04 LAB — AFP, SERUM, OPEN SPINA BIFIDA
AFP MoM: 1
AFP Value: 60.4 ng/mL
Gest. Age on Collection Date: 20.3 weeks
Maternal Age At EDD: 33.3 yr
OSBR Risk 1 IN: 10000
Test Results:: NEGATIVE
Weight: 140 [lb_av]

## 2018-12-25 ENCOUNTER — Telehealth (INDEPENDENT_AMBULATORY_CARE_PROVIDER_SITE_OTHER): Payer: No Typology Code available for payment source | Admitting: Obstetrics & Gynecology

## 2018-12-25 ENCOUNTER — Other Ambulatory Visit: Payer: Self-pay

## 2018-12-25 ENCOUNTER — Encounter: Payer: Self-pay | Admitting: Obstetrics & Gynecology

## 2018-12-25 VITALS — BP 112/70

## 2018-12-25 DIAGNOSIS — Z3A23 23 weeks gestation of pregnancy: Secondary | ICD-10-CM

## 2018-12-25 DIAGNOSIS — Z3402 Encounter for supervision of normal first pregnancy, second trimester: Secondary | ICD-10-CM

## 2018-12-25 NOTE — Patient Instructions (Signed)
TDaP Vaccine Pregnancy Get the Whooping Cough Vaccine While You Are Pregnant (CDC)  It is important for women to get the whooping cough vaccine in the third trimester of each pregnancy. Vaccines are the best way to prevent this disease. There are 2 different whooping cough vaccines. Both vaccines combine protection against whooping cough, tetanus and diphtheria, but they are for different age groups: Tdap: for everyone 11 years or older, including pregnant women  DTaP: for children 2 months through 6 years of age  You need the whooping cough vaccine during each of your pregnancies The recommended time to get the shot is during your 27th through 36th week of pregnancy, preferably during the earlier part of this time period. The Centers for Disease Control and Prevention (CDC) recommends that pregnant women receive the whooping cough vaccine for adolescents and adults (called Tdap vaccine) during the third trimester of each pregnancy. The recommended time to get the shot is during your 27th through 36th week of pregnancy, preferably during the earlier part of this time period. This replaces the original recommendation that pregnant women get the vaccine only if they had not previously received it. The American College of Obstetricians and Gynecologists and the American College of Nurse-Midwives support this recommendation.  You should get the whooping cough vaccine while pregnant to pass protection to your baby frame support disabled and/or not supported in this browser  Learn why Jordan Brown decided to get the whooping cough vaccine in her 3rd trimester of pregnancy and how her baby girl was born with some protection against the disease. Also available on YouTube. After receiving the whooping cough vaccine, your body will create protective antibodies (proteins produced by the body to fight off diseases) and pass some of them to your baby before birth. These antibodies provide your baby some short-term  protection against whooping cough in early life. These antibodies can also protect your baby from some of the more serious complications that come along with whooping cough. Your protective antibodies are at their highest about 2 weeks after getting the vaccine, but it takes time to pass them to your baby. So the preferred time to get the whooping cough vaccine is early in your third trimester. The amount of whooping cough antibodies in your body decreases over time. That is why CDC recommends you get a whooping cough vaccine during each pregnancy. Doing so allows each of your babies to get the greatest number of protective antibodies from you. This means each of your babies will get the best protection possible against this disease.  Getting the whooping cough vaccine while pregnant is better than getting the vaccine after you give birth Whooping cough vaccination during pregnancy is ideal so your baby will have short-term protection as soon as he is born. This early protection is important because your baby will not start getting his whooping cough vaccines until he is 2 months old. These first few months of life are when your baby is at greatest risk for catching whooping cough. This is also when he's at greatest risk for having severe, potentially life-threating complications from the infection. To avoid that gap in protection, it is best to get a whooping cough vaccine during pregnancy. You will then pass protection to your baby before he is born. To continue protecting your baby, he should get whooping cough vaccines starting at 2 months old. You may never have gotten the Tdap vaccine before and did not get it during this pregnancy. If so, you should make sure   to get the vaccine immediately after you give birth, before leaving the hospital or birthing center. It will take about 2 weeks before your body develops protection (antibodies) in response to the vaccine. Once you have protection from the vaccine,  you are less likely to give whooping cough to your newborn while caring for him. But remember, your baby will still be at risk for catching whooping cough from others. A recent study looked to see how effective Tdap was at preventing whooping cough in babies whose mothers got the vaccine while pregnant or in the hospital after giving birth. The study found that getting Tdap between 27 through 36 weeks of pregnancy is 85% more effective at preventing whooping cough in babies younger than 2 months old. Blood tests cannot tell if you need a whooping cough vaccine There are no blood tests that can tell you if you have enough antibodies in your body to protect yourself or your baby against whooping cough. Even if you have been sick with whooping cough in the past or previously received the vaccine, you still should get the vaccine during each pregnancy. Breastfeeding may pass some protective antibodies onto your baby By breastfeeding, you may pass some antibodies you have made in response to the vaccine to your baby. When you get a whooping cough vaccine during your pregnancy, you will have antibodies in your breast milk that you can share with your baby as soon as your milk comes in. However, your baby will not get protective antibodies immediately if you wait to get the whooping cough vaccine until after delivering your baby. This is because it takes about 2 weeks for your body to create antibodies. Learn more about the health benefits of breastfeeding.  

## 2018-12-25 NOTE — Progress Notes (Signed)
TELEHEALTH OBSTETRICS PRENATAL VIRTUAL VIDEO VISIT ENCOUNTER NOTE  Provider location: [facility]   I connected with Jordan Brown on 12/25/18 at  4:00 PM EDT by MyChart Video Encounter at home and verified that I am speaking with the correct person using two identifiers.   I discussed the limitations, risks, security and privacy concerns of performing an evaluation and management service virtually and the availability of in person appointments. I also discussed with the patient that there may be a patient responsible charge related to this service. The patient expressed understanding and agreed to proceed. Subjective:  Jordan Brown is a 33 y.o. G2P0010 at [redacted]w[redacted]d being seen today for ongoing prenatal care.  She is currently monitored for the following issues for this low-risk pregnancy and has Supervision of normal pregnancy; Group B streptococcal carriage in urine complicating pregnancy; and Bilateral mild fetal urinary tract dilation, antepartum on their problem list.  Patient reports no significant complaints.  Contractions: Not present. Vag. Bleeding: None.  Movement: Present. Denies any leaking of fluid.   The following portions of the patient's history were reviewed and updated as appropriate: allergies, current medications, past family history, past medical history, past social history, past surgical history and problem list.   Objective:   Vitals:   12/25/18 1548  BP: 112/70    Fetal Status:     Movement: Present     General:  Alert, oriented and cooperative. Patient is in no acute distress.  Respiratory: Normal respiratory effort, no problems with respiration noted  Mental Status: Normal mood and affect. Normal behavior. Normal judgment and thought content.  Rest of physical exam deferred due to type of encounter  Imaging: [place] Mfm Ob Comp + 14 Wk  Result Date: 11/28/2018  ----------------------------------------------------------------------  OBSTETRICS REPORT                       (Signed Final 11/28/2018 05:54 pm) ---------------------------------------------------------------------- Patient Info  ID #:       [id number]                          D.O.B.:  February 05, 1986 (32 yrs)  Name:       Jordan Brown               Visit Date: 11/28/2018 03:37 pm ---------------------------------------------------------------------- Performed By  Performed By:     Enriqueta Shutter           Ref. Address:     [address] [place], [place]  [place]  Attending:        Tama High MD        [facility]  Referred By:      Osborne Oman MD ---------------------------------------------------------------------- Orders   #  Description                          Code         Ordered By   1  [place] MFM OB COMP + 56 WK               [id number]     Verita Schneiders  ----------------------------------------------------------------------   #  Order #                    Accession #                 Episode #   1  [id number]                  [id number]                  [id number]  ---------------------------------------------------------------------- Indications   Encounter for antenatal screening for          Z36.3   malformations   [redacted] weeks gestation of pregnancy                Z3A.19  ---------------------------------------------------------------------- Fetal Evaluation  Num Of Fetuses:         1  Fetal Heart Rate(bpm):  149  Cardiac Activity:       Observed  Presentation:           Breech  Placenta:               Anterior  P. Cord Insertion:      Visualized  Amniotic Fluid  AFI FV:      Within  normal limits                              Largest Pocket(cm)                              4.8 ---------------------------------------------------------------------- Biometry  BPD:      45.2  mm     G. Age:  19w 5d         42  %    CI:        69.68   %    70 - 86                                                          FL/HC:      17.2   %  16.8 - 19.8  HC:      172.8  mm     G. Age:  19w 6d         41  %    HC/AC:      1.13        1.09 - 1.39  AC:      152.6  mm     G. Age:  20w 3d         65  %    FL/BPD:     65.7   %  FL:       29.7  mm     G. Age:  19w 1d         19  %    FL/AC:      19.5   %    20 - 24  HUM:      30.5  mm     G. Age:  20w 1d         59  %  CER:      20.4  mm     G. Age:  19w 3d         11  %  NFT:       3.6  mm  LV:        7.6  mm  CM:        3.1  mm  Est. FW:     317  gm    0 lb 11 oz      45  % ---------------------------------------------------------------------- OB History  Gravidity:    2         Term:   0        Prem:   0        SAB:   1  TOP:          0       Ectopic:  0        Living: 0 ---------------------------------------------------------------------- Gestational Age  LMP:           19w 6d        Date:  07/12/18                 EDD:   04/18/19  U/S Today:     19w 6d                                        EDD:   04/18/19  Best:          19w 6d     Det. By:  LMP  (07/12/18)          EDD:   04/18/19 ---------------------------------------------------------------------- Anatomy  Cranium:               Appears normal         LVOT:                   Appears normal  Cavum:                 Appears normal         Aortic Arch:            Not well visualized  Ventricles:            Appears normal         Ductal Arch:  Not well visualized  Choroid Plexus:        Appears normal         Diaphragm:              Appears normal  Cerebellum:            Appears normal         Stomach:                Appears normal, left                                                                         sided  Posterior Fossa:       Appears normal         Abdomen:                Appears normal  Nuchal Fold:           Appears normal         Abdominal Wall:         Appears nml (cord                                                                        insert, abd wall)  Face:                  Appears normal         Cord Vessels:           Appears normal (3                         (orbits and profile)                           vessel cord)  Lips:                  Appears normal         Kidneys:                Bilat UTD, Rt 85mm,                                                                        Lt 38mm  Palate:                Appears normal         Bladder:                Appears normal  Thoracic:              Appears normal         Spine:  Appears normal  Heart:                 Appears normal         Upper Extremities:      Appears normal                         (4CH, axis, and                         situs)  RVOT:                  Appears normal         Lower Extremities:      Appears normal  Other:  Nasal bone visualized. Fetus appears to be female. Open hands          visualized. 5th digit visualized. Heels visualized. ---------------------------------------------------------------------- Cervix Uterus Adnexa  Cervix  Length:           6.02  cm.  Normal appearance by transabdominal scan.  Left Ovary  Within normal limits.  Right Ovary  Not visualized. No adnexal mass visualized. ---------------------------------------------------------------------- Impression  Ms. Huck G2, P0 is here for fetal anatomy scan.  On cell  free fetal DNA screening the risks of fetal aneuploidies are  not increased.  Patient reports no chronic medical conditions.  We performed fetal anatomical survey.  Fetal biometry is  consistent with her previously established dates.  Amniotic  fluid is normal and good fetal activity seen.  Bilateral mild  urinary tract dilations are seen (measuring 5 to 6 mm each).  No other  markers of aneuploidies or fetal structural defects  are seen.  I explained the finding of UTD that most cases resolve on  your low or after birth and only rarely associated with  obstructive uropathy.  UTD is also a marker for Down  syndrome.  Given that she had low risk for Down syndrome  on cell free fetal DNA screening this should be considered a  normal variant.  I discussed the significance and limitations of cell free fetal  DNA screening and informed her that only amniocentesis will  give a definitive result on the fetal karyotype.  Patient understands the limitations of ultrasound in detecting  fetal anomalies and opted not to have amniocentesis. ---------------------------------------------------------------------- Recommendations  -An appointment was made for her to return in 8 weeks for  fetal growth and renal assessments. ----------------------------------------------------------------------                  Tama High, MD Electronically Signed Final Report   11/28/2018 05:54 pm ----------------------------------------------------------------------   Assessment and Plan:  Pregnancy: G2P0010 at [redacted]w[redacted]d 1. Encounter for supervision of normal first pregnancy in second trimester Discussed ultrasound findings, patient reassured. Questions answered.  Third trimester labs and Tdap next visit, patient advised GTT is a fasting test.  - Glucose Tolerance, 2 Hours w/1 Hour; Future - CBC; Future - HIV Antibody (routine testing w rflx); Future - RPR; Future Preterm labor symptoms and general obstetric precautions including but not limited to vaginal bleeding, contractions, leaking of fluid and fetal movement were reviewed in detail with the patient. I discussed the assessment and treatment plan with the patient. The patient was provided an opportunity to ask questions and all were answered. The patient agreed with the plan and demonstrated an understanding of the instructions. The patient was advised to  call back or seek an in-person office evaluation/go  to MAU at [facility] for any urgent or concerning symptoms. Please refer to After Visit Summary for other counseling recommendations.   I provided 15 minutes of face-to-face time during this encounter.  Return in about 4 weeks (around 01/22/2019) for 2 hr GTT, 3rd trimester labs, TDap, OFFICE OB Visit.  Future Appointments  Date Time Provider [place]  01/16/2019  1:40 PM [place] NURSE [place] M[facility]-US  01/16/2019  1:45 PM [facility]-MFC [place] 2 WH-MFCUS MFC-US    Verita Schneiders, MD [facility] [place]

## 2018-12-29 ENCOUNTER — Telehealth: Payer: No Typology Code available for payment source | Admitting: Family Medicine

## 2018-12-29 ENCOUNTER — Other Ambulatory Visit: Payer: Self-pay

## 2018-12-29 DIAGNOSIS — Z20822 Contact with and (suspected) exposure to covid-19: Secondary | ICD-10-CM

## 2018-12-30 LAB — NOVEL CORONAVIRUS, NAA: SARS-CoV-2, NAA: NOT DETECTED

## 2018-12-31 ENCOUNTER — Ambulatory Visit: Payer: No Typology Code available for payment source | Admitting: Internal Medicine

## 2019-01-12 IMAGING — US US OB COMP LESS 14 WK
1 series · 14 of 28 positions shown · non-contrast
Comparison: None.

CLINICAL DATA: Followup after miscarriage.

EXAM:
OBSTETRIC <14 WK US AND TRANSVAGINAL OB US
TECHNIQUE: Both transabdominal and transvaginal ultrasound examinations were
performed for complete evaluation of the gestation as well as the
maternal uterus, adnexal regions, and pelvic cul-de-sac.
Transvaginal technique was performed to assess early pregnancy.

[Series 1: us ob comp less 14 wk · 0.20mm/px · 14 of 91 slices shown]
[im 4/91]
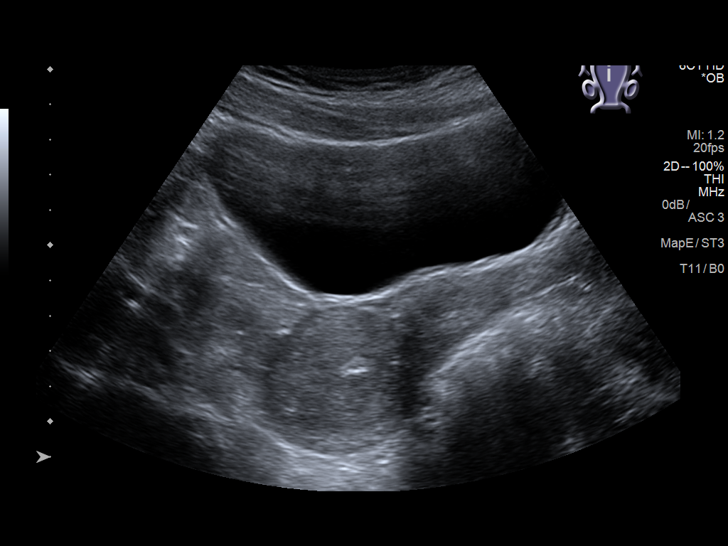
[im 11/91]
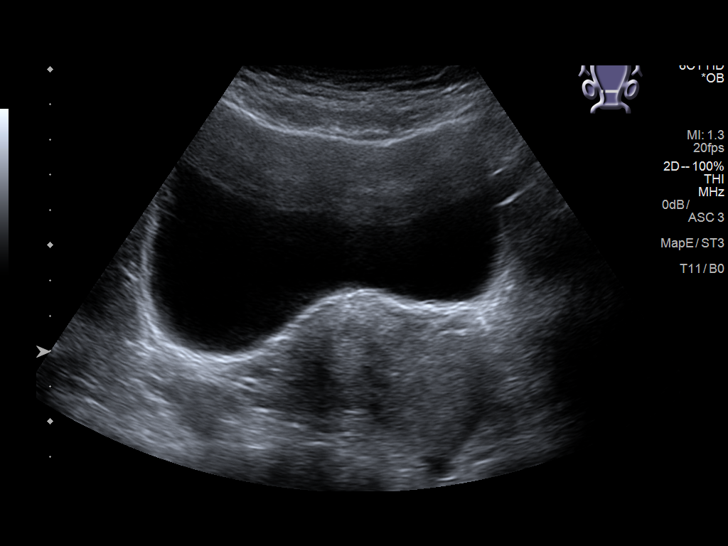
[im 17/91]
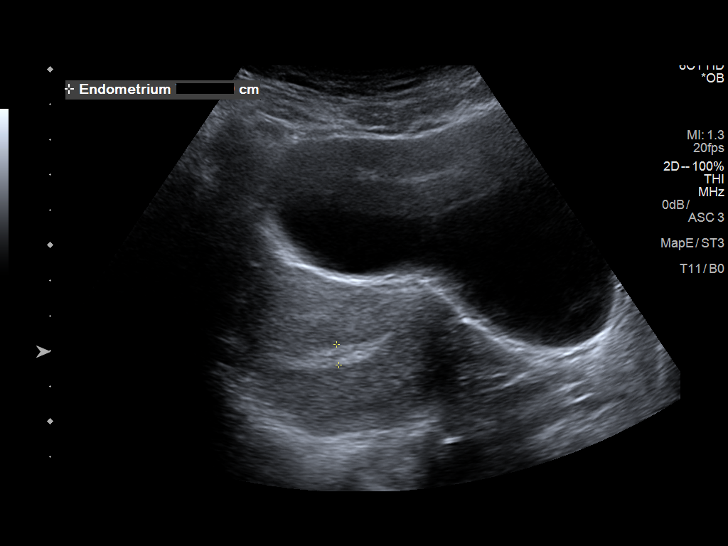
[im 24/91]
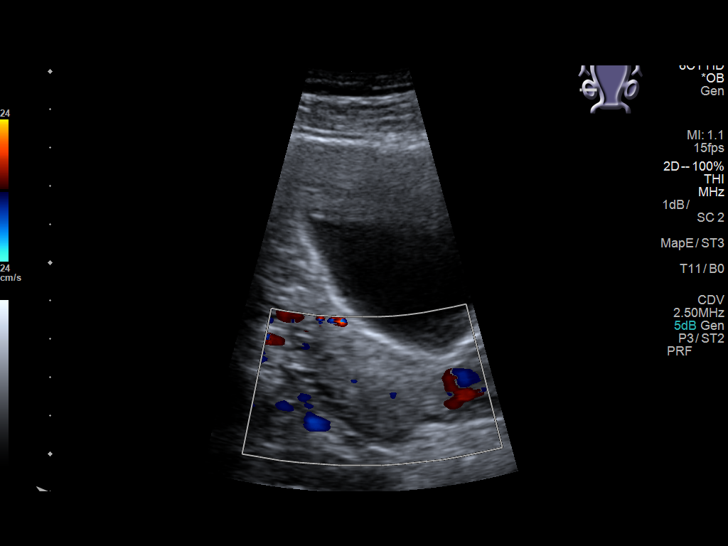
[im 31/91]
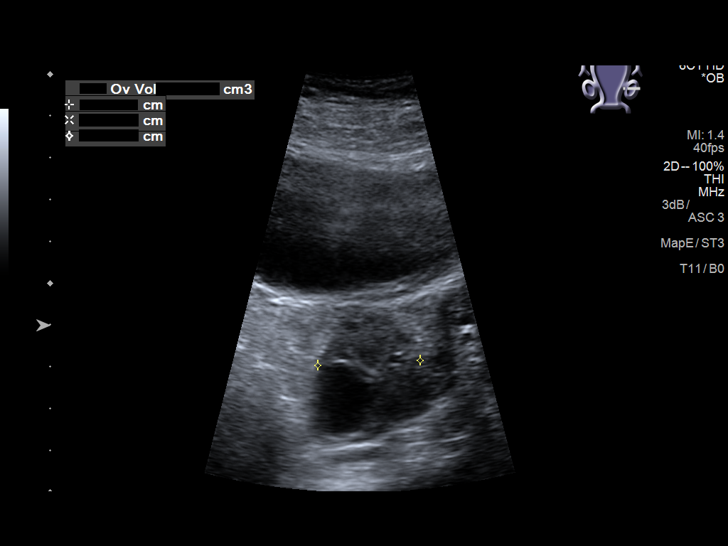
[im 37/91]
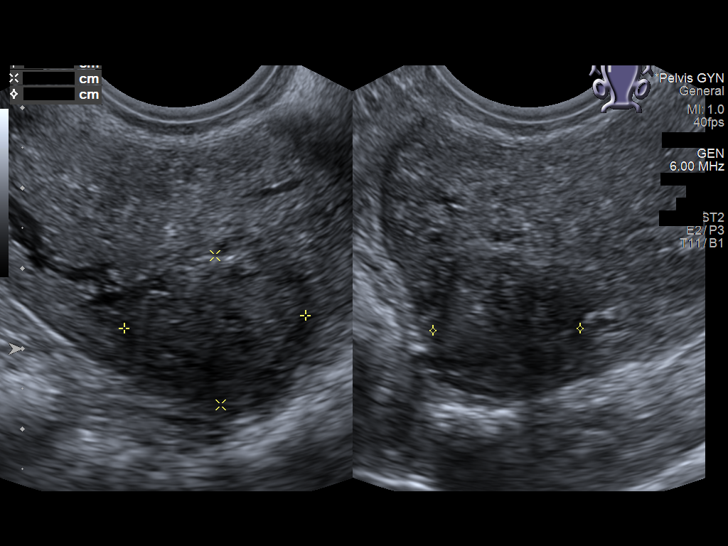
[im 44/91]
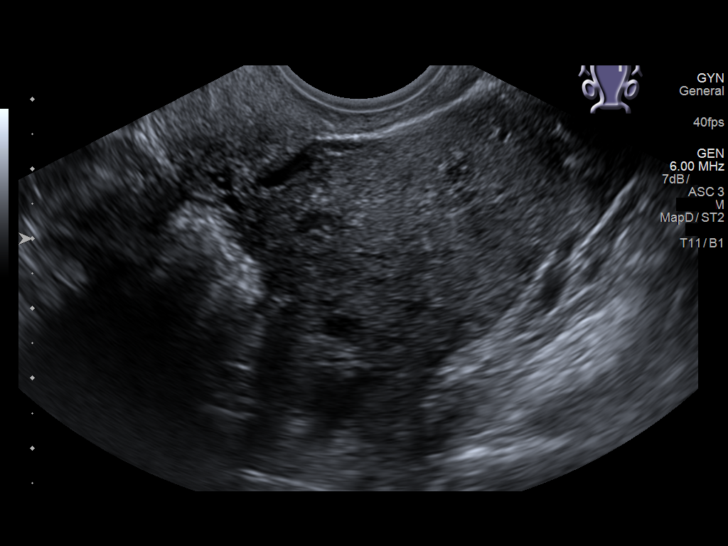
[im 51/91]
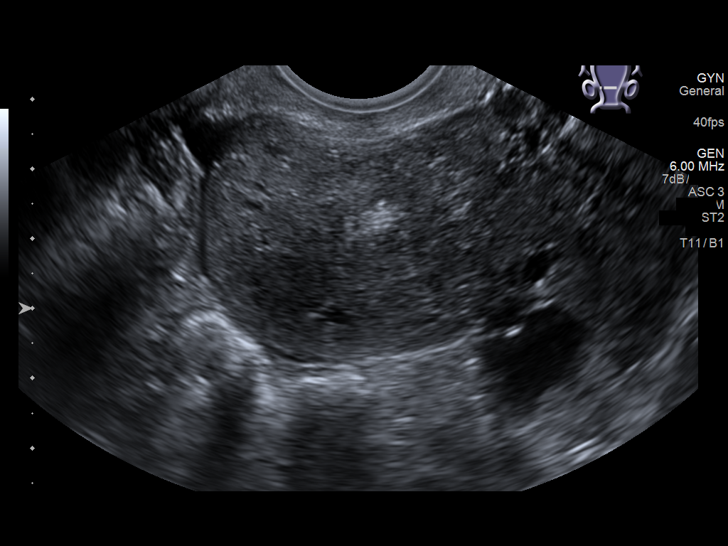
[im 57/91]
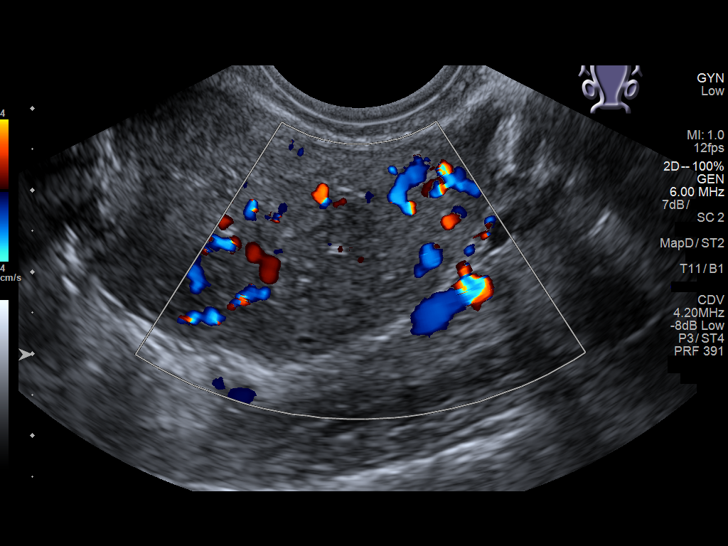
[im 64/91]
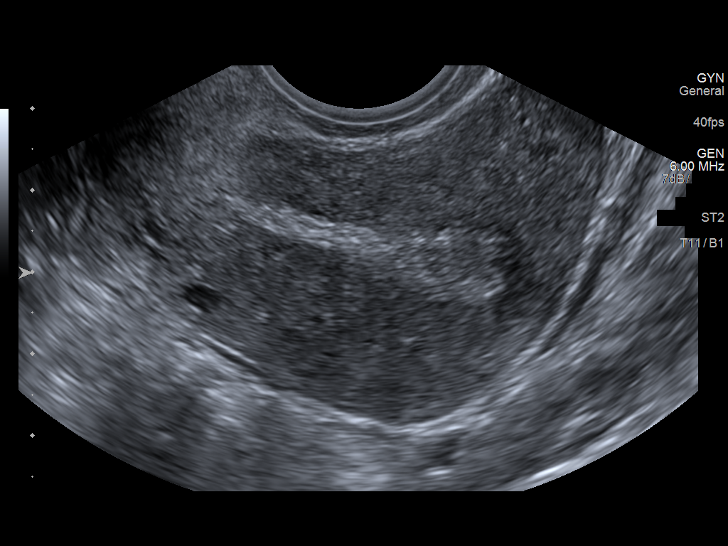
[im 71/91]
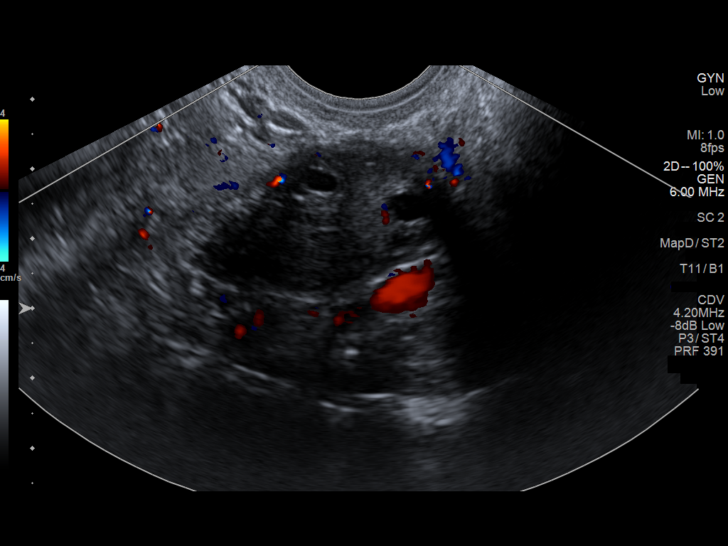
[im 77/91]
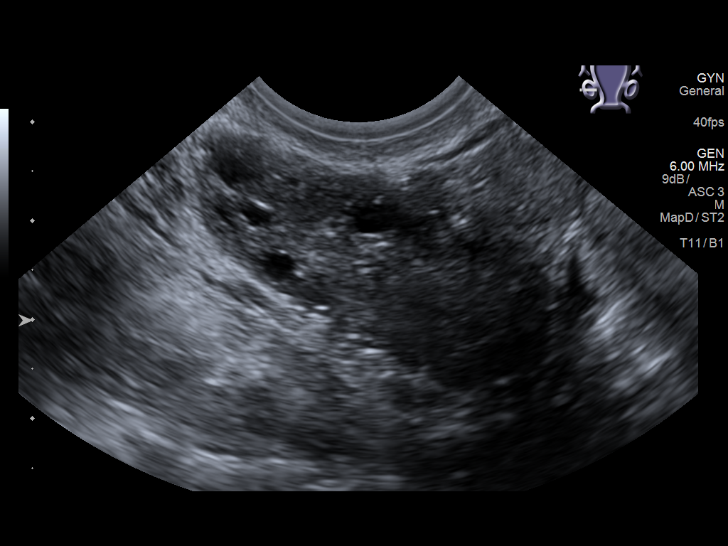
[im 84/91]
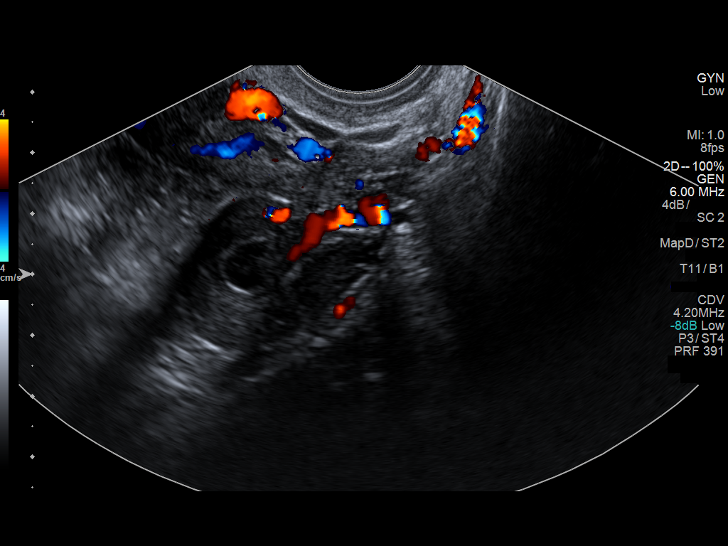
[im 91/91]
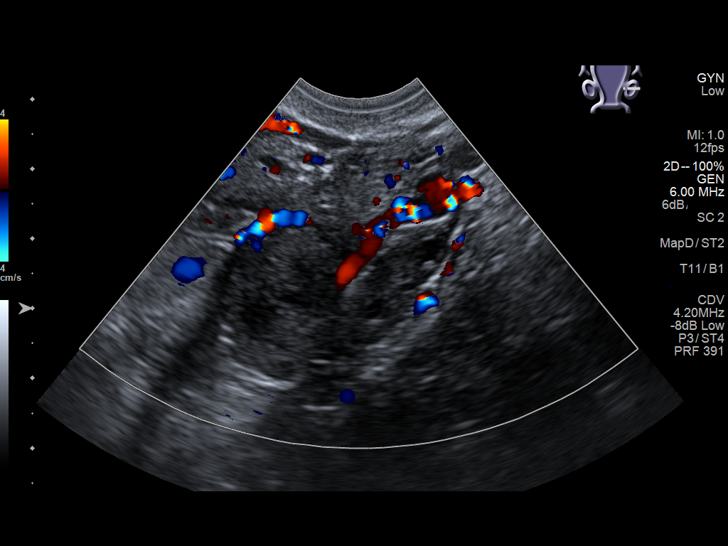

[14 of 28 positions shown; findings below may reference images not displayed]

FINDINGS: Intrauterine gestational sac: None

Yolk sac:  None

Embryo:  None

Cardiac Activity: None

Heart Rate: Absent bpm

Subchorionic hemorrhage:  None applicable

Maternal uterus/adnexae:

Uterus: 6.5 x 4.8 x 5.8 centimeters. Posterior fibroid is 2.3 x
x 1.8 centimeters. Anterior fibroid is 0.7 x 0.8 x 0.7 centimeters.

Endometrium: 9.7 millimeters. Minimally heterogeneous with no
evidence for retained products of conception.

RIGHT ovary: 4.0 x 1.6 x 2.9 centimeters.  Normal in appearance.

LEFT ovary: 4.0 x 2.2 x 2.8 centimeters. Small corpus luteum cyst is
2.2 centimeters.
IMPRESSION: 1. No evidence for intrauterine or adnexal pregnancy.
2. Normal appearance of the endometrial stripe, 9.7 millimeters in
thickness.
3. Uterine fibroids, largest measuring 2.3 centimeters.

## 2019-01-16 ENCOUNTER — Encounter (HOSPITAL_COMMUNITY): Payer: Self-pay

## 2019-01-16 ENCOUNTER — Other Ambulatory Visit: Payer: Self-pay

## 2019-01-16 ENCOUNTER — Ambulatory Visit (HOSPITAL_COMMUNITY): Payer: No Typology Code available for payment source | Admitting: *Deleted

## 2019-01-16 ENCOUNTER — Other Ambulatory Visit (HOSPITAL_COMMUNITY): Payer: Self-pay | Admitting: *Deleted

## 2019-01-16 ENCOUNTER — Ambulatory Visit (HOSPITAL_COMMUNITY)
Admission: RE | Admit: 2019-01-16 | Discharge: 2019-01-16 | Disposition: A | Discharge status: Home / Self Care | Payer: No Typology Code available for payment source | Source: Ambulatory Visit | Attending: Obstetrics and Gynecology | Admitting: Obstetrics and Gynecology

## 2019-01-16 VITALS — BP 119/64 | HR 95 | Temp 98.5°F

## 2019-01-16 DIAGNOSIS — Z3A26 26 weeks gestation of pregnancy: Secondary | ICD-10-CM | POA: Diagnosis not present

## 2019-01-16 DIAGNOSIS — O358XX0 Maternal care for other (suspected) fetal abnormality and damage, not applicable or unspecified: Secondary | ICD-10-CM | POA: Diagnosis not present

## 2019-01-16 DIAGNOSIS — Z362 Encounter for other antenatal screening follow-up: Secondary | ICD-10-CM

## 2019-01-16 DIAGNOSIS — O359XX0 Maternal care for (suspected) fetal abnormality and damage, unspecified, not applicable or unspecified: Secondary | ICD-10-CM

## 2019-01-16 DIAGNOSIS — O35EXX0 Maternal care for other (suspected) fetal abnormality and damage, fetal genitourinary anomalies, not applicable or unspecified: Secondary | ICD-10-CM

## 2019-01-29 ENCOUNTER — Ambulatory Visit (INDEPENDENT_AMBULATORY_CARE_PROVIDER_SITE_OTHER): Payer: No Typology Code available for payment source | Admitting: Obstetrics & Gynecology

## 2019-01-29 ENCOUNTER — Encounter: Payer: Self-pay | Admitting: Obstetrics & Gynecology

## 2019-01-29 ENCOUNTER — Other Ambulatory Visit: Payer: Self-pay

## 2019-01-29 VITALS — BP 101/65 | HR 80 | Wt 147.0 lb

## 2019-01-29 DIAGNOSIS — O35EXX0 Maternal care for other (suspected) fetal abnormality and damage, fetal genitourinary anomalies, not applicable or unspecified: Secondary | ICD-10-CM

## 2019-01-29 DIAGNOSIS — Z23 Encounter for immunization: Secondary | ICD-10-CM

## 2019-01-29 DIAGNOSIS — Z3A28 28 weeks gestation of pregnancy: Secondary | ICD-10-CM

## 2019-01-29 DIAGNOSIS — O358XX0 Maternal care for other (suspected) fetal abnormality and damage, not applicable or unspecified: Secondary | ICD-10-CM

## 2019-01-29 DIAGNOSIS — Z3403 Encounter for supervision of normal first pregnancy, third trimester: Secondary | ICD-10-CM

## 2019-01-29 NOTE — Patient Instructions (Signed)
Return to office for any scheduled appointments. Call the office or go to the MAU at [facility] if:  You begin to have strong, frequent contractions  Your water breaks.  Sometimes it is a big gush of fluid, sometimes it is just a trickle that keeps getting your panties wet or running down your legs  You have vaginal bleeding.  It is normal to have a small amount of spotting if your cervix was checked.   You do not feel your baby moving like normal.  If you do not, get something to eat and drink and lay down and focus on feeling your baby move.   If your baby is still not moving like normal, you should call the office or go to MAU.  Any other obstetric concerns.   

## 2019-01-29 NOTE — Progress Notes (Signed)
PRENATAL VISIT NOTE  Subjective:  Jordan Brown is a 33 y.o. G2P0010 at [redacted]w[redacted]d being seen today for ongoing prenatal care.  She is currently monitored for the following issues for this low-risk pregnancy and has Supervision of normal pregnancy; Group B streptococcal carriage in urine complicating pregnancy; and Bilateral mild fetal urinary tract dilation, antepartum on their problem list.  Patient reports no complaints.  Contractions: Not present. Vag. Bleeding: None.  Movement: Present. Denies leaking of fluid.   The following portions of the patient's history were reviewed and updated as appropriate: allergies, current medications, past family history, past medical history, past social history, past surgical history and problem list.   Objective:   Vitals:   01/29/19 0845  BP: 101/65  Pulse: 80  Weight: 147 lb (66.7 kg)    Fetal Status: Fetal Heart Rate (bpm): 145   Movement: Present     General:  Alert, oriented and cooperative. Patient is in no acute distress.  Skin: Skin is warm and dry. No rash noted.   Cardiovascular: Normal heart rate noted  Respiratory: Normal respiratory effort, no problems with respiration noted  Abdomen: Soft, gravid, appropriate for gestational age.  Pain/Pressure: Present     Pelvic: Cervical exam deferred        Extremities: Normal range of motion.  Edema: None  Mental Status: Normal mood and affect. Normal behavior. Normal judgment and thought content.   Imaging: [place] Mfm Ob Follow Up  Result Date: 01/16/2019 ----------------------------------------------------------------------  OBSTETRICS REPORT                       (Signed Final 01/16/2019 03:10 pm) ---------------------------------------------------------------------- Patient Info  ID #:       [id number]                          D.O.B.:  February 16, 1986 (33 yrs)  Name:       Jordan Brown               Visit Date: 01/16/2019 01:55 pm  ---------------------------------------------------------------------- Performed By  Performed By:     Valda Favia          Ref. Address:     [address]                                                             [place], [place]  Attending:  Tama High MD        [facility]  Referred By:      Osborne Oman MD ---------------------------------------------------------------------- Orders   #  Description                          Code         Ordered By   1  [place] MFM OB FOLLOW UP                  [id number]     Tama High  ----------------------------------------------------------------------   #  Order #                    Accession #                 Episode #   1  [id number]                  [id number]                  [id number]  ---------------------------------------------------------------------- Indications   [redacted] weeks gestation of pregnancy                Z3A.26   Encounter for other antenatal screening        Z36.2   follow-up   Fetal abnormality - other known or             O35.9XX0   suspected (bilateral UTD)  ---------------------------------------------------------------------- Fetal Evaluation  Num Of Fetuses:         1  Fetal Heart Rate(bpm):  150  Cardiac Activity:       Observed  Presentation:           Cephalic  Placenta:               Anterior  P. Cord Insertion:      Previously Visualized  Amniotic Fluid  AFI FV:      Within normal limits                              Largest Pocket(cm)                              4.1 ---------------------------------------------------------------------- Biometry  BPD:      69.4  mm     G. Age:  27w 6d         74  %    CI:        77.09   %    70 - 86  FL/HC:      19.6   %    18.6 - 20.4  HC:      250.3  mm     G. Age:  27w 1d         33  %    HC/AC:      1.10        1.05 - 1.21  AC:      228.2  mm     G. Age:  27w 1d         52  %    FL/BPD:     70.6   %    71 - 87  FL:         49  mm     G. Age:  26w 3d         25  %    FL/AC:      21.5   %    20 - 24  HUM:      43.6  mm     G. Age:  26w 0d         26  %  Est. FW:    1011  gm      2 lb 4 oz     43  % ---------------------------------------------------------------------- OB History  Gravidity:    2         Term:   0        Prem:   0        SAB:   1  TOP:          0       Ectopic:  0        Living: 0 ---------------------------------------------------------------------- Gestational Age  LMP:           26w 6d        Date:  07/12/18                 EDD:   04/18/19  U/S Today:     27w 1d                                        EDD:   04/16/19  Best:          26w 6d     Det. By:  LMP  (07/12/18)          EDD:   04/18/19 ---------------------------------------------------------------------- Anatomy  Cranium:               Appears normal         LVOT:                   Previously seen  Cavum:                 Previously seen        Aortic Arch:            Appears normal  Ventricles:            Appears normal         Ductal Arch:            Not well visualized  Choroid Plexus:        Previously seen        Diaphragm:              Previously seen  Cerebellum:  Previously seen        Stomach:                Appears normal, left                                                                        sided  Posterior Fossa:       Previously seen        Abdomen:                Previously seen  Nuchal Fold:           Previously seen        Abdominal Wall:         Previously seen  Face:                  Orbits and profile     Cord Vessels:           Previously seen                         previously seen  Lips:                  Previously seen        Kidneys:                Right mild UTD,                                                                         75mm  Palate:                Previously seen        Bladder:                Appears normal  Thoracic:              Appears normal         Spine:                  Previously seen  Heart:                 Previously seen        Upper Extremities:      Previously seen  RVOT:                  Previously seen        Lower Extremities:      Previously seen  Other:  Nasal bone visualized previously. Fetus appears to be female. Open          hands visualized previously. 5th digit visualized previously. Heels          visualized previously. ---------------------------------------------------------------------- Cervix Uterus Adnexa  Cervix  Not visualized (advanced GA >24wks)  Uterus  No abnormality visualized.  Left Ovary  No adnexal mass visualized.  Right Ovary  No adnexal mass visualized.  Cul De Sac  No free  fluid seen.  Adnexa  No abnormality visualized. ---------------------------------------------------------------------- Impression  Patient return for follow-up fetal growth and renal  assessments.  Fetal growth is appropriate for gestational age. Amniotic fluid  is normal and good fetal activity is seen.  Right mild urinary  tract dilation measuring 5 mm is seen in the left UTD has  resolved.  Both kidneys appear normal with no increased  echogenicities.  We reassured the patient findings. ---------------------------------------------------------------------- Recommendations  -An appointment was made for her to return in 8 weeks for  fetal growth and renal assessments. ----------------------------------------------------------------------                  Tama High, MD Electronically Signed Final Report   01/16/2019 03:10 pm ----------------------------------------------------------------------   Assessment and Plan:  Pregnancy: G2P0010 at [redacted]w[redacted]d 1. Mild fetal urinary tract dilation, antepartum Mild at 5 mm.  Follow up scan ordered, will follow up  recommendations  2. Encounter for supervision of normal first pregnancy in third trimester Third trimester labs and Tdap today. - Tdap vaccine greater than or equal to 7yo IM - TSH - RPR - HIV Antibody (routine testing w rflx) - CBC - Glucose Tolerance, 2 Hours w/1 Hour Preterm labor symptoms and general obstetric precautions including but not limited to vaginal bleeding, contractions, leaking of fluid and fetal movement were reviewed in detail with the patient. Please refer to After Visit Summary for other counseling recommendations.   Return in about 2 weeks (around 02/12/2019) for Virtual OB Visit.  Future Appointments  Date Time Provider [place]  03/09/2019 11:00 AM [facility]-MFC [place] 3 WH-MFCUS MFC-US  03/09/2019 11:10 AM [facility]-MFC NURSE [facility]-MFC MFC-US    Verita Schneiders, MD

## 2019-01-30 ENCOUNTER — Encounter: Payer: Self-pay | Admitting: Obstetrics & Gynecology

## 2019-01-30 DIAGNOSIS — O24419 Gestational diabetes mellitus in pregnancy, unspecified control: Secondary | ICD-10-CM | POA: Insufficient documentation

## 2019-01-30 LAB — GLUCOSE TOLERANCE, 2 HOURS W/ 1HR
Glucose, 1 hour: 211 mg/dL — ABNORMAL HIGH (ref 65–179)
Glucose, 2 hour: 151 mg/dL (ref 65–152)
Glucose, Fasting: 81 mg/dL (ref 65–91)

## 2019-01-30 LAB — CBC
Hematocrit: 35.9 % (ref 34.0–46.6)
Hemoglobin: 12 g/dL (ref 11.1–15.9)
MCH: 31 pg (ref 26.6–33.0)
MCHC: 33.4 g/dL (ref 31.5–35.7)
MCV: 93 fL (ref 79–97)
Platelets: 210 10*3/uL (ref 150–450)
RBC: 3.87 x10E6/uL (ref 3.77–5.28)
RDW: 12.3 % (ref 11.7–15.4)
WBC: 7.4 10*3/uL (ref 3.4–10.8)

## 2019-01-30 LAB — RPR: RPR Ser Ql: NONREACTIVE

## 2019-01-30 LAB — TSH: TSH: 1.04 u[IU]/mL (ref 0.450–4.500)

## 2019-01-30 LAB — HIV ANTIBODY (ROUTINE TESTING W REFLEX): HIV Screen 4th Generation wRfx: NONREACTIVE

## 2019-02-02 ENCOUNTER — Other Ambulatory Visit: Payer: Self-pay | Admitting: *Deleted

## 2019-02-02 ENCOUNTER — Encounter: Payer: Self-pay | Admitting: *Deleted

## 2019-02-02 DIAGNOSIS — O24419 Gestational diabetes mellitus in pregnancy, unspecified control: Secondary | ICD-10-CM

## 2019-02-02 MED ORDER — ACCU-CHEK GUIDE VI STRP: ORAL_STRIP | 12 refills | Status: DC

## 2019-02-02 MED ORDER — ACCU-CHEK GUIDE W/DEVICE KIT: 1.0000 | PACK | Freq: Four times a day (QID) | 0 refills | Status: DC

## 2019-02-02 MED ORDER — ACCU-CHEK FASTCLIX LANCETS MISC: 1.0000 [IU] | Freq: Four times a day (QID) | 12 refills | Status: DC

## 2019-02-04 ENCOUNTER — Other Ambulatory Visit: Payer: Self-pay | Admitting: *Deleted

## 2019-02-04 ENCOUNTER — Encounter: Payer: Self-pay | Admitting: *Deleted

## 2019-02-04 NOTE — Patient Outreach (Signed)
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 She states she was diagnosed with gestational diabetes on 01/29/19. She says her due date in 04/18/19. She says she was very surprised to hear that she had not passed her glucose tolerance test. She says only her grandfather has diabetes;  no one else in her family.  She says she is checking her blood sugar 4 times daily. She reports her fasting blood sugars as <80 and 2 hours after meals as <120. She says she has an appointment to meet with a registered dietician on  02/19/19. She says she manually enters her blood sugar readings into the Babyscript app so that her OB provider can see her values.   Objective:  N/A  Encounter Medications:  Outpatient Encounter Medications as of 02/04/2019  Medication Sig  . FreeStyle Unistick II Lancets MISC 1 % by Does not apply route QID.  Marland Kitchen glucose monitoring kit (FREESTYLE) monitoring kit 1 each by Does not apply route as needed for other.  . prenatal vitamin w/FE, FA (PRENATAL 1 + 1) 27-1 MG TABS tablet Take 1 tablet by mouth daily at 12 noon.   No facility-administered encounter medications on file as of 02/04/2019.     Functional Status:  In your present state of health, do you have any difficulty performing the following activities: 02/04/2019  Hearing? N  Vision? N  Difficulty concentrating or making decisions? N  Walking or climbing stairs? N  Dressing or bathing? N  Doing errands, shopping? N  Preparing Food and eating ? N  Using the Toilet? N  Managing your Medications? N  Managing your Finances? N  Housekeeping or managing your Housekeeping? N  Some recent data might be hidden     Fall/Depression Screening: No flowsheet data found. PHQ 2/9 Scores 02/04/2019  PHQ - 2 Score 0    Assessment:  [place] gastroenterologist recently diagnosed with gestational diabetes, enrolling in the [facility] CM Diabetes During Pregnancy Program today.  Plan:  [facility] CM Care Plan Problem One     Most Recent Value  Care Plan Problem One  Knowledge deficit related to self-management of diabetes during pregnancy  Role Documenting the Problem One  Care Management Coordinator  Care Plan for Problem One  Active  THN Long Term Goal  In the next 90 days patient will demonstrate good understanding of self-management  of diabetes as evidenced by: self-monitoring of blood sugars 4 times daily or as prescribed with 90% of blood sugars meeting target, adherence to Plate Method or carb controlled meal plan, completion of assigned Emmi gestational diabetes modules, attending gestational diabetes class, adherence to provider appointments, adherence in contacting this RNCM at least monthly per program guidelines, delivery of healthy baby with no pre or post-delivery maternal or fetal complications related to gestational diabetes  THN Long Term Goal Start Date  02/04/19  Interventions for Problem One Long Term Goal Reviewed [place] Management Diabetes During Pregnancy Program guidelines and benefits, securely e-mailed information packet with explanation of contents to patient's personal e-mail address, activated program benefits for patient, reviewed the American Diabetes Association recommendations related to frequency  of glucose testing and targets, reviewed hypoglycemia, frequency of occurrence, hypoglycemic threshold, and symptoms. Reviewed  rule of 15s for treating hypoglycemia,  reviewed strategies to treat elevated glucose, reviewed  plate method and/or basic carbohydrate counting, discussed effect of stress on blood sugar and reviewed coping strategies, reinforced the importance of  keeping provider appointments, encouraged patient to contact this RNCM for questions or concerns related to diabetes during pregnancy self-management.    This RNCM will send today's office visit note to patient'sOBprovider. This RNCM willensure monthly contact with patientand as needed to assist withgestational diabetesself management during her pregnancyand assess patient's progress towardmutually set goals of meeting blood sugar targets and weight gain targets and patient's personal goals of "control blood sugar so I don't have to be on medications and the baby does well".  Barrington Ellison RN,CCM,CDE [place] Management Coordinator Office Phone [phone] Office Fax [phone]

## 2019-02-09 ENCOUNTER — Telehealth: Payer: Self-pay | Admitting: Lactation Services

## 2019-02-09 NOTE — Telephone Encounter (Signed)
Pt called with concerns that she has been set up for an appt with Education and is not sure she can make it to the appt as scheduled. Pt is asking for a call back.   Called pt back to speak about her concerns. LM for pt. To call the office at her earliest convenience to reschedule the appt as needed.

## 2019-02-17 ENCOUNTER — Ambulatory Visit: Payer: No Typology Code available for payment source | Admitting: Family Medicine

## 2019-02-18 ENCOUNTER — Telehealth (INDEPENDENT_AMBULATORY_CARE_PROVIDER_SITE_OTHER): Payer: No Typology Code available for payment source | Admitting: Family Medicine

## 2019-02-18 ENCOUNTER — Other Ambulatory Visit: Payer: Self-pay

## 2019-02-18 ENCOUNTER — Encounter: Payer: Self-pay | Admitting: Family Medicine

## 2019-02-18 VITALS — BP 115/75

## 2019-02-18 DIAGNOSIS — O35EXX0 Maternal care for other (suspected) fetal abnormality and damage, fetal genitourinary anomalies, not applicable or unspecified: Secondary | ICD-10-CM

## 2019-02-18 DIAGNOSIS — O358XX0 Maternal care for other (suspected) fetal abnormality and damage, not applicable or unspecified: Secondary | ICD-10-CM

## 2019-02-18 DIAGNOSIS — O99820 Streptococcus B carrier state complicating pregnancy: Secondary | ICD-10-CM

## 2019-02-18 DIAGNOSIS — O24410 Gestational diabetes mellitus in pregnancy, diet controlled: Secondary | ICD-10-CM

## 2019-02-18 DIAGNOSIS — Z3A31 31 weeks gestation of pregnancy: Secondary | ICD-10-CM

## 2019-02-18 NOTE — Progress Notes (Signed)
Baby is moving well.

## 2019-02-18 NOTE — Progress Notes (Signed)
I connected with@ on 02/18/19 at  4:00 PM EST by: MyChart and verified that I am speaking with the correct person using two identifiers.  Patient is located at Home and provider is located at [facility].     The purpose of this virtual visit is to provide medical care while limiting exposure to the novel coronavirus. I discussed the limitations, risks, security and privacy concerns of performing an evaluation and management service by Mychart and the availability of in person appointments. I also discussed with the patient that there may be a patient responsible charge related to this service. By engaging in this virtual visit, you consent to the provision of healthcare.  Additionally, you authorize for your insurance to be billed for the services provided during this visit.  The patient expressed understanding and agreed to proceed.  The following staff members participated in the virtual visit:  Demetrice Phillip Heal and Crosby Oyster    PRENATAL VISIT NOTE  Subjective:  Jordan Brown is a 33 y.o. G2P0010 at [redacted]w[redacted]d  for phone visit for ongoing prenatal care.  She is currently monitored for the following issues for this high-risk pregnancy and has Supervision of high-risk pregnancy; Group B streptococcal carriage in urine complicating pregnancy; Bilateral mild fetal urinary tract dilation, antepartum; and Gestational diabetes mellitus (GDM) in third trimester on their problem list.  Patient reports no complaints.  Contractions: Not present.  .  Movement: Present. Denies leaking of fluid.   The following portions of the patient's history were reviewed and updated as appropriate: allergies, current medications, past family history, past medical history, past social history, past surgical history and problem list.   Objective:   Vitals:   02/18/19 1623  BP: 115/75   Self-Obtained  Fetal Status:     Movement: Present     Fastings-- all within goal 79-85 2hr PP- most < 120 (only two above goal at  122, 134)  Assessment and Plan:  Pregnancy: G2P0010 at [redacted]w[redacted]d 1. Group B streptococcal carriage complicating pregnancy Reviewed plan for PCN in labor  2. Pregnancy affected by genitourinary abnormality of fetus, single or unspecified fetus Has had appropriate follow up and pylectasis is improved/resolved. Has [place] scheduled at 34 wks to recheck  3. Gestational diabetes mellitus (GDM) in third trimester controlled on oral hypoglycemic drug Glucose monitoring WNL Reports good control see above and knows when sugars will be high based on dietary indiscretions Reviewed the plan of care for GDM with IOL at 40 wks and [place] at ~36 wks for EFW Patient took online GDM course and does have DM education appt but feels it will be less useful. Discussed with patient that it is reasonable to cancel this appointment since she had completed education and has excellent control.  Discussed rescheduling [place] for closer to 36 wk if possible but if not we can use the 34 wk to determine the growth curve of fetus.  If > 75th% at 34 wks will consider repeat at 37 wks. If not in upper quartile and sugars remain WNL/at goal likely little utility to repeat.   Preterm labor symptoms and general obstetric precautions including but not limited to vaginal bleeding, contractions, leaking of fluid and fetal movement were reviewed in detail with the patient.  No follow-ups on file.  Future Appointments  Date Time Provider [place]  03/03/2019 11:15 AM [place]  03/09/2019 11:00 AM [place] 3 WH-MFCUS MFC-US  03/09/2019 11:10 AM WH-MFC NURSE WH-MFC MFC-US     Time spent on  virtual visit: 15 minutes  Caren Macadam, MD

## 2019-02-19 ENCOUNTER — Other Ambulatory Visit: Payer: No Typology Code available for payment source

## 2019-02-23 ENCOUNTER — Other Ambulatory Visit: Payer: Self-pay | Admitting: *Deleted

## 2019-02-23 NOTE — Patient Outreach (Signed)
[facility]) Care Management  02/23/2019  Shavondra Macadams 06/30/1985 [id number]   Diabetes During Pregnancy Note  Shakeshia is a member of [place] Management Diabetes During Pregnancy Programprogram. Her due date is 04/18/19. E-mail sent to [place] at 12:23 pm today requesting update on the status of her gestational diabetes control.  Plan: Await reply from [place].  Barrington Ellison RN,CCM,CDE [place] Management Coordinator Office Phone [phone] Office Fax [phone]

## 2019-02-25 ENCOUNTER — Other Ambulatory Visit: Payer: Self-pay | Admitting: *Deleted

## 2019-02-25 NOTE — Patient Outreach (Signed)
[facility]) Care Management  02/25/2019  Jordan Brown [birth year] [id number]  Diabetes During Pregnancy Note  Received reply e-mail from [place] on 02/24/19 at 8:42 pm.  Subjective: Jordan Brown reports "everything is going well, fingersticks have been weill controlled except for 2 readings". Jordan Brown says she knows the exact cause of the elevated blood sugars; due to food indiscretion. She also says her OB provider is following her blood sugars very closely.  Plan: Sent reply e-mail thanking Jordan Brown for the update and wishing her happy holidays. Also advised her this RNCM will send a request for follow up next month to assess status of gestational diabetes.  Barrington Ellison RN,CCM,CDE [place] Management Coordinator Office Phone [phone] Office Fax [phone]

## 2019-03-03 ENCOUNTER — Other Ambulatory Visit: Payer: No Typology Code available for payment source

## 2019-03-04 ENCOUNTER — Encounter: Payer: Self-pay | Admitting: *Deleted

## 2019-03-09 ENCOUNTER — Ambulatory Visit (HOSPITAL_COMMUNITY): Payer: No Typology Code available for payment source

## 2019-03-10 ENCOUNTER — Telehealth (INDEPENDENT_AMBULATORY_CARE_PROVIDER_SITE_OTHER): Payer: No Typology Code available for payment source | Admitting: Family Medicine

## 2019-03-10 ENCOUNTER — Other Ambulatory Visit: Payer: Self-pay

## 2019-03-10 DIAGNOSIS — O24410 Gestational diabetes mellitus in pregnancy, diet controlled: Secondary | ICD-10-CM

## 2019-03-10 DIAGNOSIS — O99820 Streptococcus B carrier state complicating pregnancy: Secondary | ICD-10-CM

## 2019-03-10 DIAGNOSIS — O0993 Supervision of high risk pregnancy, unspecified, third trimester: Secondary | ICD-10-CM

## 2019-03-10 DIAGNOSIS — Z3A34 34 weeks gestation of pregnancy: Secondary | ICD-10-CM

## 2019-03-10 NOTE — Patient Instructions (Signed)

## 2019-03-10 NOTE — Progress Notes (Signed)
   TELEHEALTH VIRTUAL OBSTETRICS VISIT ENCOUNTER NOTE  I connected with Jordan Brown on 03/10/19 at  8:15 AM EST by telephone at home and verified that I am speaking with the correct person using two identifiers.   I discussed the limitations, risks, security and privacy concerns of performing an evaluation and management service by telephone and the availability of in person appointments. I also discussed with the patient that there may be a patient responsible charge related to this service. The patient expressed understanding and agreed to proceed.  Subjective:  Jordan Brown is a 33 y.o. G2P0010 at [redacted]w[redacted]d being followed for ongoing prenatal care.  She is currently monitored for the following issues for this high-risk pregnancy and has Supervision of high-risk pregnancy; Group B streptococcal carriage in urine complicating pregnancy; Bilateral mild fetal urinary tract dilation, antepartum; and Gestational diabetes mellitus (GDM) in third trimester on their problem list.  Patient reports no complaints. Reports fetal movement. Denies any contractions, bleeding or leaking of fluid.   The following portions of the patient's history were reviewed and updated as appropriate: allergies, current medications, past family history, past medical history, past social history, past surgical history and problem list.   Objective:   General:  Alert, oriented and cooperative.   Mental Status: Normal mood and affect perceived. Normal judgment and thought content.  Rest of physical exam deferred due to type of encounter  Assessment and Plan:  Pregnancy: G2P0010 at [redacted]w[redacted]d 1. Diet controlled gestational diabetes mellitus (GDM) in third trimester See BabyScripts DM record--excellent control. Discussed no need for antenatal testing as long as she is diet only. U/S for growth scheduled at 36 wks  2. Group B streptococcal carriage complicating pregnancy Will need treatment in labor  3. Supervision of  high risk pregnancy in third trimester Return for 36 wk cultures, GBS is already positive  Preterm labor symptoms and general obstetric precautions including but not limited to vaginal bleeding, contractions, leaking of fluid and fetal movement were reviewed in detail with the patient.  I discussed the assessment and treatment plan with the patient. The patient was provided an opportunity to ask questions and all were answered. The patient agreed with the plan and demonstrated an understanding of the instructions. The patient was advised to call back or seek an in-person office evaluation/go to MAU at [facility] for any urgent or concerning symptoms. Please refer to After Visit Summary for other counseling recommendations.   I provided 11 minutes of non-face-to-face time during this encounter.  Return in 2 weeks (on 03/24/2019) for in person.  Future Appointments  Date Time Provider [place]  03/25/2019  8:00 AM [place] MFC-US  03/25/2019  8:15 AM [place] 4 WH-MFCUS MFC-US    Donnamae Jude, [place] for [facility], [place]

## 2019-03-12 ENCOUNTER — Ambulatory Visit (HOSPITAL_COMMUNITY): Payer: No Typology Code available for payment source

## 2019-03-20 NOTE — L&D Delivery Note (Addendum)
Delivery Note At 7:45 PM a viable and healthy female was delivered via Vaginal, Spontaneous (Presentation: Left Occiput Anterior). No nuchal cord. APGAR: 9, 9; weight  pending.   Placenta status: Spontaneous, Intact.  Cord: 3 vessels with the following complications: None.    Anesthesia: Epidural Episiotomy: None Lacerations: 1st degree; Right Labial; Periurethral Suture Repair: 3.0, 4.0 Vicryl  Est. Blood Loss (mL): 729  Mom to postpartum.  Baby to Nursery.  Gladys Damme 04/03/2019, 8:25 PM  OB FELLOW DELIVERY ATTESTATION  I was gloved and present for the delivery in its entirety, and I agree with the above resident's note.  Presented in active labor. Initial SVE 7/100/-1. Received AROM, Pitocin and epidural and progressed to complete with uncomplicated delivery. EBL secondary to bleeding of lacerations during repair.   Barrington Ellison, MD [facility] Family Medicine Fellow, [facility] for [employer], [place]

## 2019-03-23 ENCOUNTER — Ambulatory Visit (HOSPITAL_COMMUNITY): Payer: No Typology Code available for payment source

## 2019-03-25 ENCOUNTER — Ambulatory Visit (HOSPITAL_COMMUNITY): Payer: No Typology Code available for payment source | Admitting: *Deleted

## 2019-03-25 ENCOUNTER — Other Ambulatory Visit: Payer: Self-pay | Admitting: *Deleted

## 2019-03-25 ENCOUNTER — Encounter (HOSPITAL_COMMUNITY): Payer: Self-pay

## 2019-03-25 ENCOUNTER — Ambulatory Visit (HOSPITAL_COMMUNITY)
Admission: RE | Admit: 2019-03-25 | Discharge: 2019-03-25 | Disposition: A | Discharge status: Home / Self Care | Payer: No Typology Code available for payment source | Source: Ambulatory Visit | Attending: Obstetrics and Gynecology | Admitting: Obstetrics and Gynecology

## 2019-03-25 ENCOUNTER — Other Ambulatory Visit: Payer: Self-pay

## 2019-03-25 DIAGNOSIS — O359XX0 Maternal care for (suspected) fetal abnormality and damage, unspecified, not applicable or unspecified: Secondary | ICD-10-CM

## 2019-03-25 DIAGNOSIS — Z362 Encounter for other antenatal screening follow-up: Secondary | ICD-10-CM | POA: Diagnosis not present

## 2019-03-25 DIAGNOSIS — Z3A36 36 weeks gestation of pregnancy: Secondary | ICD-10-CM

## 2019-03-25 DIAGNOSIS — O99820 Streptococcus B carrier state complicating pregnancy: Secondary | ICD-10-CM

## 2019-03-25 DIAGNOSIS — O24410 Gestational diabetes mellitus in pregnancy, diet controlled: Secondary | ICD-10-CM

## 2019-03-25 NOTE — Patient Outreach (Signed)
[facility]) Care Management  03/25/2019  Jordan Brown [birth year] [id number]   Diabetes During Pregnancy Note  Jordan Brown is a member of [place] Management Diabetes During Pregnancy Programprogram. Her due date is 04/18/19. E-mail sent to [facility] e-mail address advising her that telehealth visit note with OB MD of 12/22/was reviewed and congratulaing her on a good report and her success in management of her gestational diabetes.  Also wished her well on the upcoming birth of her baby and that this RNCM will cal her within 72 hours of hospital discharge to compete a  transition of care call.  Plan: Await return response from [place].  Barrington Ellison [place] Management Coordinator Office Phone [phone] Office Fax [phone]

## 2019-03-30 ENCOUNTER — Ambulatory Visit (INDEPENDENT_AMBULATORY_CARE_PROVIDER_SITE_OTHER): Payer: No Typology Code available for payment source | Admitting: Family Medicine

## 2019-03-30 ENCOUNTER — Other Ambulatory Visit: Payer: Self-pay

## 2019-03-30 VITALS — BP 120/72 | HR 76 | Wt 153.0 lb

## 2019-03-30 DIAGNOSIS — O24410 Gestational diabetes mellitus in pregnancy, diet controlled: Secondary | ICD-10-CM

## 2019-03-30 DIAGNOSIS — O0993 Supervision of high risk pregnancy, unspecified, third trimester: Secondary | ICD-10-CM

## 2019-03-30 DIAGNOSIS — Z113 Encounter for screening for infections with a predominantly sexual mode of transmission: Secondary | ICD-10-CM

## 2019-03-30 DIAGNOSIS — Z3A37 37 weeks gestation of pregnancy: Secondary | ICD-10-CM

## 2019-03-30 DIAGNOSIS — O99820 Streptococcus B carrier state complicating pregnancy: Secondary | ICD-10-CM

## 2019-03-30 NOTE — Progress Notes (Signed)
   PRENATAL VISIT NOTE  Subjective:  Jordan Brown is a 34 y.o. G2P0010 at [redacted]w[redacted]d being seen today for ongoing prenatal care.  She is currently monitored for the following issues for this high-risk pregnancy and has Supervision of high-risk pregnancy; Group B streptococcal carriage in urine complicating pregnancy; Bilateral mild fetal urinary tract dilation, antepartum; and Gestational diabetes mellitus (GDM) in third trimester on their problem list.  Patient reports no complaints.  Contractions: Not present. Vag. Bleeding: None.  Movement: Present. Denies leaking of fluid.   The following portions of the patient's history were reviewed and updated as appropriate: allergies, current medications, past family history, past medical history, past social history, past surgical history and problem list.   Objective:   Vitals:   03/30/19 1456  BP: 120/72  Pulse: 76  Weight: 153 lb (69.4 kg)    Fetal Status: Fetal Heart Rate (bpm): 138   Movement: Present     General:  Alert, oriented and cooperative. Patient is in no acute distress.  Skin: Skin is warm and dry. No rash noted.   Cardiovascular: Normal heart rate noted  Respiratory: Normal respiratory effort, no problems with respiration noted  Abdomen: Soft, gravid, appropriate for gestational age.  Pain/Pressure: Absent     Pelvic: Cervical exam deferred        Extremities: Normal range of motion.  Edema: None  Mental Status: Normal mood and affect. Normal behavior. Normal judgment and thought content.   Assessment and Plan:  Pregnancy: G2P0010 at [redacted]w[redacted]d 1. Supervision of high risk pregnancy in third trimester Up to date - GC/Chlamydia  2. Diet controlled gestational diabetes mellitus (GDM) in third trimester Fasting controlled. Occasional PP elevated but usually related to know dietary indiscretion Overall incredibly well controlled EFW at 36 wks= 2552  gm    5 lb 10 oz      15  % Reviewed recommendation for IOl at 40 wks given  diet controlled GDM  3. Group B streptococcal carriage in urine complicating pregnancy PCN in labor  Preterm labor symptoms and general obstetric precautions including but not limited to vaginal bleeding, contractions, leaking of fluid and fetal movement were reviewed in detail with the patient. Please refer to After Visit Summary for other counseling recommendations.   Return in about 1 week (around 04/06/2019) for Routine prenatal care, Telehealth/Virtual health OB Visit.  Future Appointments  Date Time Provider [place]  04/08/2019  4:15 PM Darlina Rumpf, [place] [facility]   Caren Macadam, MD

## 2019-03-30 NOTE — Patient Instructions (Addendum)
[place] -- Lactation and COVID 19 vaccine  [email or web address]   ACOG statement [email or web address]

## 2019-04-01 LAB — GC/CHLAMYDIA PROBE AMP (~~LOC~~) NOT AT ARMC
Chlamydia: NEGATIVE
Comment: NEGATIVE
Comment: NORMAL
Neisseria Gonorrhea: NEGATIVE

## 2019-04-02 ENCOUNTER — Inpatient Hospital Stay (HOSPITAL_COMMUNITY)
Admission: AD | Admit: 2019-04-02 | Discharge: 2019-04-02 | Disposition: A | Discharge status: Home / Self Care | Payer: No Typology Code available for payment source | Source: Home / Self Care | Attending: Obstetrics and Gynecology | Admitting: Obstetrics and Gynecology

## 2019-04-02 ENCOUNTER — Other Ambulatory Visit: Payer: Self-pay

## 2019-04-02 ENCOUNTER — Telehealth: Payer: Self-pay | Admitting: *Deleted

## 2019-04-02 ENCOUNTER — Encounter (HOSPITAL_COMMUNITY): Payer: Self-pay | Admitting: Obstetrics and Gynecology

## 2019-04-02 DIAGNOSIS — Z3A37 37 weeks gestation of pregnancy: Secondary | ICD-10-CM | POA: Insufficient documentation

## 2019-04-02 DIAGNOSIS — O99820 Streptococcus B carrier state complicating pregnancy: Secondary | ICD-10-CM

## 2019-04-02 DIAGNOSIS — O471 False labor at or after 37 completed weeks of gestation: Secondary | ICD-10-CM | POA: Insufficient documentation

## 2019-04-02 DIAGNOSIS — O479 False labor, unspecified: Secondary | ICD-10-CM

## 2019-04-02 DIAGNOSIS — O24410 Gestational diabetes mellitus in pregnancy, diet controlled: Secondary | ICD-10-CM

## 2019-04-02 NOTE — MAU Provider Note (Signed)
None     Chief Complaint:  Contractions   Jordan Brown is  34 y.o. G2P0010 at 62w5dpresents complaining of Contractions .  She states irregular, every 5-8 minutes contractions are associated with none vaginal bleeding, intact membranes, along with active fetal movement.  Ctx since 0300, now 5-7 minutes apart for 1.5 houir.Feels them in her back.    Obstetrical/Gynecological History: OB History    Gravida  2   Para      Term      Preterm      AB  1   Living  0     SAB  1   TAB      Ectopic      Multiple      Live Births  0          Past Medical History: Past Medical History:  Diagnosis Date  . Gestational diabetes   . Medical history non-contributory     Past Surgical History: Past Surgical History:  Procedure Laterality Date  . NO PAST SURGERIES      Family History: History reviewed. No pertinent family history.  Social History: Social History   Tobacco Use  . Smoking status: Never Smoker  . Smokeless tobacco: Never Used  Substance Use Topics  . Alcohol use: Never  . Drug use: Never    Allergies: No Known Allergies  Meds:  Medications Prior to Admission  Medication Sig Dispense Refill Last Dose  . FreeStyle Unistick II Lancets MISC 1 % by Does not apply route QID.   04/01/2019 at Unknown time  . glucose monitoring kit (FREESTYLE) monitoring kit 1 each by Does not apply route as needed for other.   04/01/2019 at Unknown time  . prenatal vitamin w/FE, FA (PRENATAL 1 + 1) 27-1 MG TABS tablet Take 1 tablet by mouth daily at 12 noon.   04/01/2019 at Unknown time  . Accu-Chek FastClix Lancets MISC 1 Units by Percutaneous route 4 (four) times daily. (Patient not taking: Reported on 02/04/2019) 100 each 12   . Blood Glucose Monitoring Suppl (ACCU-CHEK GUIDE) w/Device KIT 1 Device by Does not apply route 4 (four) times daily. (Patient not taking: Reported on 02/04/2019) 1 kit 0   . glucose blood (ACCU-CHEK GUIDE) test strip Use to check blood  sugars four times a day was instructed (Patient not taking: Reported on 02/04/2019) 50 each 12     Review of Systems   Constitutional: Negative for fever and chills Eyes: Negative for visual disturbances Respiratory: Negative for shortness of breath, dyspnea Cardiovascular: Negative for chest pain or palpitations  Gastrointestinal: Negative for vomiting, diarrhea and constipation Genitourinary: Negative for dysuria and urgency Musculoskeletal: Negative for back pain, joint pain, myalgias.  Normal ROM  Neurological: Negative for dizziness and headaches    Physical Exam  Blood pressure 129/81, pulse 72, resp. rate 17, height '5\' 5"'  (1.651 m), weight 69.1 kg, last menstrual period 07/12/2018, SpO2 100 %. GENERAL: Well-developed, well-nourished female in no acute distress.  LUNGS: Normal respiratory effort HEART: Regular rate and rhythm. ABDOMEN: Soft, nontender, nondistended, gravid.  EXTREMITIES: Nontender, no edema, 2+ distal pulses. DTR's 2+ CERVICAL EXAM: Dilatation 2cm   Effacement 80%   Station -2   Presentation: cephalic FHT:  Baseline rate 145 bpm   Variability moderate  Accelerations present   Decelerations none Contractions: Every 5-8 mins  Pt observed for a few hours w/o cx change.    Labs: No results found for this or any previous visit (from the past  24 hour(s)). Imaging Studies:    Assessment: Jordan Brown is  34 y.o. G2P0010 at 62w5dpresents with early vs false labor.  Plan: DC home, labor precautions given. Therapeutic rest offered and declined.   FChristin Fudge DNP, CNM 1/14/20218:09 PM

## 2019-04-02 NOTE — MAU Note (Addendum)
Pt c/o contractions since 0300. Now 5-7 mins apart for last 1 - 1.5 hrs.  Denies LOF or VB. +FM.

## 2019-04-02 NOTE — Telephone Encounter (Signed)
Called pt to follow up on after hours nurse line message regarding pt having some contractions.Pt states they are irregular and have eased up some currently. Discussed labor precautions with pt. Denies any leaking of fluid or vaginal bleeding and baby is moving well.  To call if she has any questions or concerns.

## 2019-04-03 ENCOUNTER — Inpatient Hospital Stay (HOSPITAL_COMMUNITY): Payer: No Typology Code available for payment source | Admitting: Anesthesiology

## 2019-04-03 ENCOUNTER — Inpatient Hospital Stay (HOSPITAL_COMMUNITY)
Admission: AD | Admit: 2019-04-03 | Discharge: 2019-04-05 | DRG: 806 | Disposition: A | Discharge status: Home / Self Care | Payer: No Typology Code available for payment source | Attending: Cardiology | Admitting: Cardiology

## 2019-04-03 ENCOUNTER — Encounter (HOSPITAL_COMMUNITY): Payer: Self-pay | Admitting: Obstetrics and Gynecology

## 2019-04-03 DIAGNOSIS — O24419 Gestational diabetes mellitus in pregnancy, unspecified control: Secondary | ICD-10-CM | POA: Diagnosis present

## 2019-04-03 DIAGNOSIS — Z20822 Contact with and (suspected) exposure to covid-19: Secondary | ICD-10-CM | POA: Diagnosis present

## 2019-04-03 DIAGNOSIS — O24410 Gestational diabetes mellitus in pregnancy, diet controlled: Secondary | ICD-10-CM

## 2019-04-03 DIAGNOSIS — O99824 Streptococcus B carrier state complicating childbirth: Secondary | ICD-10-CM | POA: Diagnosis present

## 2019-04-03 DIAGNOSIS — O0993 Supervision of high risk pregnancy, unspecified, third trimester: Secondary | ICD-10-CM

## 2019-04-03 DIAGNOSIS — O358XX0 Maternal care for other (suspected) fetal abnormality and damage, not applicable or unspecified: Secondary | ICD-10-CM | POA: Diagnosis present

## 2019-04-03 DIAGNOSIS — O471 False labor at or after 37 completed weeks of gestation: Secondary | ICD-10-CM | POA: Diagnosis present

## 2019-04-03 DIAGNOSIS — O134 Gestational [pregnancy-induced] hypertension without significant proteinuria, complicating childbirth: Secondary | ICD-10-CM | POA: Diagnosis present

## 2019-04-03 DIAGNOSIS — O24420 Gestational diabetes mellitus in childbirth, diet controlled: Secondary | ICD-10-CM | POA: Diagnosis present

## 2019-04-03 DIAGNOSIS — O35EXX0 Maternal care for other (suspected) fetal abnormality and damage, fetal genitourinary anomalies, not applicable or unspecified: Secondary | ICD-10-CM | POA: Diagnosis present

## 2019-04-03 DIAGNOSIS — O139 Gestational [pregnancy-induced] hypertension without significant proteinuria, unspecified trimester: Secondary | ICD-10-CM

## 2019-04-03 DIAGNOSIS — Z3A37 37 weeks gestation of pregnancy: Secondary | ICD-10-CM

## 2019-04-03 DIAGNOSIS — O0990 Supervision of high risk pregnancy, unspecified, unspecified trimester: Secondary | ICD-10-CM

## 2019-04-03 DIAGNOSIS — O99820 Streptococcus B carrier state complicating pregnancy: Secondary | ICD-10-CM

## 2019-04-03 LAB — TYPE AND SCREEN
ABO/RH(D): A POS
Antibody Screen: NEGATIVE

## 2019-04-03 LAB — CBC
HCT: 39.8 % (ref 36.0–46.0)
Hemoglobin: 13.7 g/dL (ref 12.0–15.0)
MCH: 31.4 pg (ref 26.0–34.0)
MCHC: 34.4 g/dL (ref 30.0–36.0)
MCV: 91.1 fL (ref 80.0–100.0)
Platelets: 172 10*3/uL (ref 150–400)
RBC: 4.37 MIL/uL (ref 3.87–5.11)
RDW: 13.1 % (ref 11.5–15.5)
WBC: 11.3 10*3/uL — ABNORMAL HIGH (ref 4.0–10.5)
nRBC: 0 % (ref 0.0–0.2)

## 2019-04-03 LAB — RPR: RPR Ser Ql: NONREACTIVE

## 2019-04-03 LAB — RESPIRATORY PANEL BY RT PCR (FLU A&B, COVID)
Influenza A by PCR: NEGATIVE
Influenza B by PCR: NEGATIVE
SARS Coronavirus 2 by RT PCR: NEGATIVE

## 2019-04-03 LAB — ABO/RH: ABO/RH(D): A POS

## 2019-04-03 LAB — GLUCOSE, CAPILLARY
Glucose-Capillary: 125 mg/dL — ABNORMAL HIGH (ref 70–99)
Glucose-Capillary: 90 mg/dL (ref 70–99)
Glucose-Capillary: 117 mg/dL — ABNORMAL HIGH (ref 70–99)

## 2019-04-03 MED ORDER — LACTATED RINGERS IV SOLN: INTRAVENOUS | Status: DC

## 2019-04-03 MED ORDER — EPHEDRINE 5 MG/ML INJ: 10.0000 mg | INTRAVENOUS | Status: DC | PRN

## 2019-04-03 MED ORDER — PHENYLEPHRINE 40 MCG/ML (10ML) SYRINGE FOR IV PUSH (FOR BLOOD PRESSURE SUPPORT): 80.0000 ug | PREFILLED_SYRINGE | INTRAVENOUS | Status: DC | PRN

## 2019-04-03 MED ORDER — LIDOCAINE HCL (PF) 1 % IJ SOLN: 30.0000 mL | INTRAMUSCULAR | Status: DC | PRN

## 2019-04-03 MED ORDER — FENTANYL CITRATE (PF) 100 MCG/2ML IJ SOLN: 50.0000 ug | INTRAMUSCULAR | Status: DC | PRN

## 2019-04-03 MED ORDER — OXYTOCIN 40 UNITS IN NORMAL SALINE INFUSION - SIMPLE MED
2.5000 [IU]/h | INTRAVENOUS | Status: DC
  Filled 2019-04-03: qty 1000

## 2019-04-03 MED ORDER — TERBUTALINE SULFATE 1 MG/ML IJ SOLN: 0.2500 mg | Freq: Once | INTRAMUSCULAR | Status: DC | PRN

## 2019-04-03 MED ORDER — COCONUT OIL OIL
1.0000 "application " | TOPICAL_OIL | Status: DC | PRN
  Administered 2019-04-04: 1 via TOPICAL

## 2019-04-03 MED ORDER — SOD CITRATE-CITRIC ACID 500-334 MG/5ML PO SOLN: 30.0000 mL | ORAL | Status: DC | PRN

## 2019-04-03 MED ORDER — LACTATED RINGERS IV SOLN: 500.0000 mL | Freq: Once | INTRAVENOUS | Status: DC

## 2019-04-03 MED ORDER — OXYCODONE-ACETAMINOPHEN 5-325 MG PO TABS: 2.0000 | ORAL_TABLET | ORAL | Status: DC | PRN

## 2019-04-03 MED ORDER — ACETAMINOPHEN 325 MG PO TABS: 650.0000 mg | ORAL_TABLET | ORAL | Status: DC | PRN

## 2019-04-03 MED ORDER — ZOLPIDEM TARTRATE 5 MG PO TABS: 5.0000 mg | ORAL_TABLET | Freq: Every evening | ORAL | Status: DC | PRN

## 2019-04-03 MED ORDER — OXYTOCIN BOLUS FROM INFUSION: 500.0000 mL | Freq: Once | INTRAVENOUS | Status: DC

## 2019-04-03 MED ORDER — OXYTOCIN 40 UNITS IN NORMAL SALINE INFUSION - SIMPLE MED: 2.5000 [IU]/h | INTRAVENOUS | Status: DC

## 2019-04-03 MED ORDER — ONDANSETRON HCL 4 MG/2ML IJ SOLN: 4.0000 mg | Freq: Four times a day (QID) | INTRAMUSCULAR | Status: DC | PRN

## 2019-04-03 MED ORDER — OXYTOCIN BOLUS FROM INFUSION
500.0000 mL | Freq: Once | INTRAVENOUS | Status: AC
  Administered 2019-04-03: 20:00:00 500 mL via INTRAVENOUS

## 2019-04-03 MED ORDER — OXYCODONE-ACETAMINOPHEN 5-325 MG PO TABS: 1.0000 | ORAL_TABLET | ORAL | Status: DC | PRN

## 2019-04-03 MED ORDER — LACTATED RINGERS IV SOLN: 500.0000 mL | INTRAVENOUS | Status: DC | PRN

## 2019-04-03 MED ORDER — IBUPROFEN 600 MG PO TABS
600.0000 mg | ORAL_TABLET | Freq: Four times a day (QID) | ORAL | Status: DC
  Filled 2019-04-03 (×3): qty 1

## 2019-04-03 MED ORDER — SENNOSIDES-DOCUSATE SODIUM 8.6-50 MG PO TABS
2.0000 | ORAL_TABLET | ORAL | Status: DC
  Filled 2019-04-03: qty 2

## 2019-04-03 MED ORDER — ONDANSETRON HCL 4 MG/2ML IJ SOLN: 4.0000 mg | INTRAMUSCULAR | Status: DC | PRN

## 2019-04-03 MED ORDER — PRENATAL MULTIVITAMIN CH
1.0000 | ORAL_TABLET | Freq: Every day | ORAL | Status: DC
  Filled 2019-04-03 (×2): qty 1

## 2019-04-03 MED ORDER — DIPHENHYDRAMINE HCL 50 MG/ML IJ SOLN: 12.5000 mg | INTRAMUSCULAR | Status: DC | PRN

## 2019-04-03 MED ORDER — DIBUCAINE (PERIANAL) 1 % EX OINT: 1.0000 "application " | TOPICAL_OINTMENT | CUTANEOUS | Status: DC | PRN

## 2019-04-03 MED ORDER — LIDOCAINE HCL (PF) 1 % IJ SOLN
30.0000 mL | INTRAMUSCULAR | Status: DC | PRN
  Filled 2019-04-03: qty 30

## 2019-04-03 MED ORDER — SODIUM CHLORIDE 0.9 % IV SOLN
2.0000 g | Freq: Once | INTRAVENOUS | Status: AC
  Administered 2019-04-03: 08:00:00 2 g via INTRAVENOUS
  Filled 2019-04-03: qty 2000

## 2019-04-03 MED ORDER — FENTANYL-BUPIVACAINE-NACL 0.5-0.125-0.9 MG/250ML-% EP SOLN: 12.0000 mL/h | EPIDURAL | Status: DC | PRN

## 2019-04-03 MED ORDER — TETANUS-DIPHTH-ACELL PERTUSSIS 5-2.5-18.5 LF-MCG/0.5 IM SUSP: 0.5000 mL | Freq: Once | INTRAMUSCULAR | Status: DC

## 2019-04-03 MED ORDER — WITCH HAZEL-GLYCERIN EX PADS: 1.0000 "application " | MEDICATED_PAD | CUTANEOUS | Status: DC | PRN

## 2019-04-03 MED ORDER — FENTANYL-BUPIVACAINE-NACL 0.5-0.125-0.9 MG/250ML-% EP SOLN
EPIDURAL | Status: AC
  Filled 2019-04-03: qty 250

## 2019-04-03 MED ORDER — LIDOCAINE HCL (PF) 1 % IJ SOLN
INTRAMUSCULAR | Status: DC | PRN
  Administered 2019-04-03 (×2): 4 mL via EPIDURAL

## 2019-04-03 MED ORDER — BENZOCAINE-MENTHOL 20-0.5 % EX AERO
1.0000 "application " | INHALATION_SPRAY | CUTANEOUS | Status: DC | PRN
  Administered 2019-04-04: 1 via TOPICAL
  Filled 2019-04-03: qty 56

## 2019-04-03 MED ORDER — PENICILLIN G POT IN DEXTROSE 60000 UNIT/ML IV SOLN
3.0000 10*6.[IU] | INTRAVENOUS | Status: DC
  Administered 2019-04-03 (×2): 3 10*6.[IU] via INTRAVENOUS
  Filled 2019-04-03 (×5): qty 50

## 2019-04-03 MED ORDER — ONDANSETRON HCL 4 MG PO TABS: 4.0000 mg | ORAL_TABLET | ORAL | Status: DC | PRN

## 2019-04-03 MED ORDER — OXYTOCIN 40 UNITS IN NORMAL SALINE INFUSION - SIMPLE MED
1.0000 m[IU]/min | INTRAVENOUS | Status: DC
  Administered 2019-04-03: 2 m[IU]/min via INTRAVENOUS

## 2019-04-03 MED ORDER — DIPHENHYDRAMINE HCL 25 MG PO CAPS: 25.0000 mg | ORAL_CAPSULE | Freq: Four times a day (QID) | ORAL | Status: DC | PRN

## 2019-04-03 MED ORDER — PHENYLEPHRINE 40 MCG/ML (10ML) SYRINGE FOR IV PUSH (FOR BLOOD PRESSURE SUPPORT)
80.0000 ug | PREFILLED_SYRINGE | INTRAVENOUS | Status: DC | PRN
  Filled 2019-04-03: qty 10

## 2019-04-03 MED ORDER — SIMETHICONE 80 MG PO CHEW: 80.0000 mg | CHEWABLE_TABLET | ORAL | Status: DC | PRN

## 2019-04-03 MED ORDER — SODIUM CHLORIDE (PF) 0.9 % IJ SOLN
INTRAMUSCULAR | Status: DC | PRN
  Administered 2019-04-03: 12 mL/h via EPIDURAL

## 2019-04-03 NOTE — Progress Notes (Signed)
Labor Progress Note Jordan Brown is a 34 y.o. G2P0010 at [redacted]w[redacted]d presented for SOL, GDM S:  Patient and fetus tolerating position very well.  O:  BP 123/81   Pulse 70   Temp 98.8 F (37.1 C) (Oral)   Resp 18   Ht 5\' 5"  (1.651 m)   Wt 68.9 kg   LMP 07/12/2018 (Exact Date)   SpO2 97%   BMI 25.28 kg/m  EFM: 130 bpm/+accels/-decels  CVE: Dilation: 9 Effacement (%): 100 Cervical Position: Middle Station: Plus 2 Presentation: Vertex Exam by:: Dr Chauncey Reading   A&P: 34 y.o. G2P0010 [redacted]w[redacted]d here for SOL, GDM. #Labor: Fetal descent to +2, still anterior lip present of cervix around 11 o'clock. Ctx pattern much improved, regular, q13min. Will recheck in 1 hour. #Pain: Epidural #FWB: Cat I #GBS positive, PCN ppx #GDM: last CBG 90 Gladys Damme, MD 5:41 PM

## 2019-04-03 NOTE — MAU Note (Signed)
. .  Jordan Brown is a 34 y.o. at [redacted]w[redacted]d here in MAU reporting: ctx less than 5 mins apart. No VB or LOF. + FM. Pain score: 10 Vitals:   04/03/19 0736  BP: 140/78  Pulse: 78  Resp: 18  Temp: 98.5 F (36.9 C)  SpO2: 100%     FHT:130 Lab orders placed from triage:

## 2019-04-03 NOTE — Progress Notes (Addendum)
Labor Progress Note Jordan Brown is a 34 y.o. G2P0010 at [redacted]w[redacted]d presented for SOL, GDM S:  There was 1 decel with position change which lasted about 1 min and easily resolved with a return to original position. Patient is very numb, epidural sufficient.  O:  BP 119/75   Pulse 72   Temp 97.6 F (36.4 C) (Oral)   Resp 18   Ht 5\' 5"  (1.651 m)   Wt 68.9 kg   LMP 07/12/2018 (Exact Date)   SpO2 97%   BMI 25.28 kg/m  EFM: 130 bpm/+accels/+1 variable  CVE: Dilation: 9 Effacement (%): 100 Cervical Position: Middle Station: Plus 1 Presentation: Vertex Exam by:: Wess Botts RNC   A&P: 34 y.o. G2P0010 [redacted]w[redacted]d here for SOL, GDM. #Labor: Progressing well. Anterior lip of cervix is still present, expect vaginal delivery soon. #Pain: Epidural #FWB: Cat II #GBS positive, completed first 4 hour amp ppx, now PCN ppx #GDM: last sugar 125 after popsicle/jello.   Gladys Damme, MD 12:49 PM

## 2019-04-03 NOTE — Discharge Summary (Addendum)
Postpartum Discharge Summary     Patient Name: Jordan Brown DOB: [birth year] MRN: [id number]  Date of admission: 04/03/2019 Delivering Provider: Gladys Damme   Date of discharge: 04/05/2019  Admitting diagnosis: Normal labor [O80, Z37.9] Uterine contractions during pregnancy [O62.2] Vaginal delivery [O80] Intrauterine pregnancy: [redacted]w[redacted]d    Secondary diagnosis:  Active Problems:   Supervision of high-risk pregnancy   Group B streptococcal carriage in urine complicating pregnancy   Bilateral mild fetal urinary tract dilation, antepartum   Gestational diabetes mellitus (GDM) in third trimester   Normal labor   Vaginal delivery   Gestational hypertension  Additional problems: None     Discharge diagnosis: Term Pregnancy Delivered and GDM A1                                                                                                Post partum procedures:None  Augmentation: AROM and Pitocin  Complications: None  Hospital course:  Onset of Labor With Vaginal Delivery     34y.o. yo G2P0010 at 352w6das admitted in Active Labor on 04/03/2019. Patient had an uncomplicated labor course as follows: Initial SVE 7/100/-1. Received AROM, Pitocin and epidural and progressed to complete with uncomplicated delivery. Membrane Rupture Time/Date: 4:15 PM ,04/03/2019   Intrapartum Procedures: Episiotomy: None [1]                                         Lacerations:  1st degree [2];Labial [10];Periurethral [8]  Patient had a delivery of a Viable infant. 04/03/2019  Information for the patient's newborn:  TaPriyal, Musquiz0[[id number]]Delivery Method: Vag-Spont     Pateint had an uncomplicated postpartum course. Fasting AM glucose 106 (not truly fasting, had a drink) and needs GTT outpatient nonetheless. Declined birth control. She is ambulating, tolerating a regular diet, passing flatus, and urinating well. Patient is discharged home in stable condition on 04/05/19.  Delivery  time: 7:45 PM    Magnesium Sulfate received: No BMZ received: No Rhophylac:No MMR:No Transfusion:No  Physical exam  Vitals:   04/04/19 1000 04/04/19 1659 04/04/19 2120 04/05/19 0539  BP: 119/77 119/83 127/75 113/86  Pulse: 96 98 91 72  Resp: '18 18 17 16  ' Temp: 98.6 F (37 C) 98.5 F (36.9 C) 98 F (36.7 C) 98.4 F (36.9 C)  TempSrc: Oral Oral Oral Oral  SpO2: 97% 98% 99% 99%  Weight:      Height:       General: alert, cooperative and no distress Lochia: appropriate Uterine Fundus: firm Incision: N/A DVT Evaluation: No evidence of DVT seen on physical exam. No cords or calf tenderness. No significant calf/ankle edema. Labs: Lab Results  Component Value Date   WBC 11.3 (H) 04/03/2019   HGB 13.7 04/03/2019   HCT 39.8 04/03/2019   MCV 91.1 04/03/2019   PLT 172 04/03/2019   CMP Latest Ref Rng & Units 04/04/2019  Glucose 70 - 99 mg/dL 106(H)  BUN 6 - 20 mg/dL -  Creatinine 0.57 - 1.00  mg/dL -  Sodium 134 - 144 mmol/L -  Potassium 3.5 - 5.2 mmol/L -  Chloride 96 - 106 mmol/L -  CO2 20 - 29 mmol/L -  Calcium 8.7 - 10.2 mg/dL -  Total Protein 6.0 - 8.5 g/dL -  Total Bilirubin 0.0 - 1.2 mg/dL -  Alkaline Phos 39 - 117 IU/L -  AST 0 - 40 IU/L -  ALT 0 - 32 IU/L -    Discharge instruction: per After Visit Summary and "Baby and Me Booklet".  After visit meds:  Allergies as of 04/05/2019   No Known Allergies     Medication List    STOP taking these medications   Accu-Chek FastClix Lancets Misc   Accu-Chek Guide test strip Generic drug: glucose blood   Accu-Chek Guide w/Device Kit     TAKE these medications   acetaminophen 325 MG tablet Commonly known as: Tylenol Take 2 tablets (650 mg total) by mouth every 4 (four) hours as needed (for pain scale < 4).   ibuprofen 600 MG tablet Commonly known as: ADVIL Take 1 tablet (600 mg total) by mouth every 6 (six) hours.   polyethylene glycol 17 g packet Commonly known as: MIRALAX / GLYCOLAX Take 17 g by  mouth daily.   prenatal multivitamin Tabs tablet Take 1 tablet by mouth daily at 12 noon.   senna-docusate 8.6-50 MG tablet Commonly known as: Senokot-S Take 2 tablets by mouth daily. Start taking on: April 06, 2019   witch hazel-glycerin pad Commonly known as: TUCKS Apply 1 application topically as needed for hemorrhoids.       Diet: routine diet  Activity: Advance as tolerated. Pelvic rest for 6 weeks.   Outpatient follow up:4 weeks Follow up Appt: Future Appointments  Date Time Provider [place]  04/13/2019  1:45 PM Anyanwu, Sallyanne Havers, MD [facility]   Follow up Visit:   Please schedule this patient for Postpartum visit in: 4 weeks with the following provider: Any provider High risk pregnancy complicated by: GDM Delivery mode:  SVD Anticipated Birth Control:  other/unsure PP Procedures needed: 2 hour GTT  Schedule Integrated BH visit: no      Newborn Data: Live born female  Birth Weight: 2665g   APGAR: 28, 9  Newborn Delivery   Birth date/time: 04/03/2019 19:45:00 Delivery type: Vaginal, Spontaneous      Baby Feeding: Breast Disposition:home with mother   04/05/2019 Chauncey Mann, MD

## 2019-04-03 NOTE — Progress Notes (Addendum)
Labor Progress Note Shaquela Olea is a 34 y.o. G2P0010 at [redacted]w[redacted]d presented for SOL. S: Non reducible anterior cervical lip present, patient still doing well, pain under good control. 2 variable decels resolved with position change to high fowlers.  O:  BP 130/82   Pulse 78   Temp 98.3 F (36.8 C) (Oral)   Resp 18   Ht 5\' 5"  (1.651 m)   Wt 68.9 kg   LMP 07/12/2018 (Exact Date)   SpO2 97%   BMI 25.28 kg/m  EFM: 125bpm/+accel/-decels  CVE: Dilation: 9 Effacement (%): 100 Cervical Position: Middle Station: Plus 1 Presentation: Vertex Exam by:: Wess Botts RNC, Dr Chauncey Reading   A&P: 34 y.o. G2P0010 [redacted]w[redacted]d here for SOL. #Labor: Progressing well. Patient prefers fewer interventions, only AROM if unstable vitals/EFM. Changed position to L side with peanut ball, fetus tolerating well. Plan to recheck in 1-2 hours, if no change will offer pitocin as ctx pattern is irregular. Discussed risk and benefits of AROM and pitocin, patient and FOB prefer conservative approach. #Pain: Epidural #FWB: Cat I #GBS positive, ppx PCN #GDM CBG 90  Gladys Damme, MD 1:59 PM

## 2019-04-03 NOTE — Anesthesia Preprocedure Evaluation (Signed)
Anesthesia Evaluation  Patient identified by MRN, date of birth, ID band Patient awake    Reviewed: Allergy & Precautions, Patient's Chart, lab work & pertinent test results  History of Anesthesia Complications Negative for: history of anesthetic complications  Airway Mallampati: II  TM Distance: >3 FB Neck ROM: Full    Dental no notable dental hx.    Pulmonary neg pulmonary ROS,    Pulmonary exam normal        Cardiovascular negative cardio ROS Normal cardiovascular exam     Neuro/Psych negative neurological ROS  negative psych ROS   GI/Hepatic negative GI ROS, Neg liver ROS,   Endo/Other  diabetes, Gestational  Renal/GU negative Renal ROS  negative genitourinary   Musculoskeletal negative musculoskeletal ROS (+)   Abdominal   Peds  Hematology negative hematology ROS (+)   Anesthesia Other Findings Day of surgery medications reviewed with patient.  Reproductive/Obstetrics (+) Pregnancy                             Anesthesia Physical Anesthesia Plan  ASA: II  Anesthesia Plan: Epidural   Post-op Pain Management:    Induction:   PONV Risk Score and Plan: Treatment may vary due to age or medical condition  Airway Management Planned: Natural Airway  Additional Equipment:   Intra-op Plan:   Post-operative Plan:   Informed Consent: I have reviewed the patients History and Physical, chart, labs and discussed the procedure including the risks, benefits and alternatives for the proposed anesthesia with the patient or authorized representative who has indicated his/her understanding and acceptance.       Plan Discussed with:   Anesthesia Plan Comments:         Anesthesia Quick Evaluation

## 2019-04-03 NOTE — Progress Notes (Signed)
Labor Progress Note Deshondra Ansell is a 34 y.o. G2P0010 at [redacted]w[redacted]d presented for SOL, GDM S:  Patient and fetus tolerating position very well.  O:  BP 127/77   Pulse 71   Temp 98.3 F (36.8 C) (Oral)   Resp 18   Ht 5\' 5"  (1.651 m)   Wt 68.9 kg   LMP 07/12/2018 (Exact Date)   SpO2 97%   BMI 25.28 kg/m  EFM: 125 bpm/+accels/-decels  CVE: Dilation: 9 Effacement (%): 100 Cervical Position: Middle Station: Plus 1 Presentation: Vertex Exam by:: Dr Chauncey Reading   A&P: 34 y.o. G2P0010 [redacted]w[redacted]d here for SOL, GDM. #Labor: No change in exam, discussed again the risks and benefits of augmentation, including risks of prolonged labor. Based on shared decision making, patient and FOB wish to AROM now. AROM with light meconium, discussed that EFM is reassuring, but meconium is a sign of some distress, possibly from earlier decels. Patient and FOB subsequently decided to also start pitocin. Will recheck after 1 hour on pitocin unless called earlier. #Pain: Epidural #FWB: Cat I #GBS positive, completed first 4 hour amp ppx, now PCN ppx #GDM: last CBG 90 Gladys Damme, MD 4:19 PM

## 2019-04-03 NOTE — Plan of Care (Signed)
  Problem: Education: Goal: Knowledge of condition will improve Outcome: Progressing   Problem: Activity: Goal: Will verbalize the importance of balancing activity with adequate rest periods Outcome: Progressing

## 2019-04-03 NOTE — Anesthesia Procedure Notes (Signed)
Epidural Patient location during procedure: OB Start time: 04/03/2019 8:48 AM End time: 04/03/2019 8:51 AM  Staffing Anesthesiologist: Brennan Bailey, MD Performed: anesthesiologist   Preanesthetic Checklist Completed: patient identified, IV checked, risks and benefits discussed, monitors and equipment checked, pre-op evaluation and timeout performed  Epidural Patient position: sitting Prep: DuraPrep and site prepped and draped Patient monitoring: continuous pulse ox, blood pressure and heart rate Approach: midline Location: L3-L4 Injection technique: LOR air  Needle:  Needle type: Tuohy  Needle gauge: 17 G Needle length: 9 cm Needle insertion depth: 6 cm Catheter type: closed end flexible Catheter size: 19 Gauge Catheter at skin depth: 10 cm Test dose: negative and Other (1% lidocaine)  Assessment Events: blood not aspirated, injection not painful, no injection resistance, no paresthesia and negative IV test  Additional Notes Patient identified. Risks, benefits, and alternatives discussed with patient including but not limited to bleeding, infection, nerve damage, paralysis, failed block, incomplete pain control, headache, blood pressure changes, nausea, vomiting, reactions to medication, itching, and postpartum back pain. Confirmed with bedside nurse the patient's most recent platelet count. Confirmed with patient that they are not currently taking any anticoagulation, have any bleeding history, or any family history of bleeding disorders. Patient expressed understanding and wished to proceed. All questions were answered. Sterile technique was used throughout the entire procedure. Please see nursing notes for vital signs.   Crisp LOR after one needle redirection. Test dose was given through epidural catheter and negative prior to continuing to dose epidural or start infusion. Warning signs of high block given to the patient including shortness of breath, tingling/numbness in  hands, complete motor block, or any concerning symptoms with instructions to call for help. Patient was given instructions on fall risk and not to get out of bed. All questions and concerns addressed with instructions to call with any issues or inadequate analgesia.  Reason for block:procedure for pain

## 2019-04-03 NOTE — H&P (Addendum)
OBSTETRIC ADMISSION HISTORY AND PHYSICAL  Jordan Brown is a 34 y.o. female G2P0010 with IUP at 5w6dby LMP c/w 153MIUKoreacomplicated by GDMa1, GBS (+), presenting for active labor. She reports +FMs, No LOF, no VB, no blurry vision, headaches or peripheral edema, and RUQ pain.  She plans on breast feeding. She plans on using natural family planning for birth control. [facility]  Dating: By LMP and 19wk [place]orea--->  Estimated Date of Delivery: 04/18/19  Sono:    '@[redacted]w[redacted]d' , CWD, bilateral UTD R 663m L5m4mbreech presentation, anterior placental lie, 317g, 45% EFW '@[redacted]w[redacted]d' , CWD, asymmetric growth: head circumference -1 SD, UTD R renal pelvis 7mm56mpperlimit normal-mild UTD), AC enlarged, cephalic presentation, anterior placental lie, 2552g, 15% EFW  Prenatal History/Complications: GDM third trimester, diet controlled GBS bacteriuria UTD bilateral renal enlargement  Past Medical History: Past Medical History:  Diagnosis Date  . Gestational diabetes   . Medical history non-contributory     Past Surgical History: Past Surgical History:  Procedure Laterality Date  . NO PAST SURGERIES      Obstetrical History: OB History    Gravida  2   Para      Term      Preterm      AB  1   Living  0     SAB  1   TAB      Ectopic      Multiple      Live Births  0           Social History: Social History   Socioeconomic History  . Marital status: Married    Spouse name: VikaJaydon SorokaNumber of children: Not on file  . Years of education: Not on file  . Highest education level: Professional school degree (e.g., MD, DDS, DVM, JD)  Occupational History  . Occupation: [employer] Use  . Smoking status: Never Smoker  . Smokeless tobacco: Never Used  Substance and Sexual Activity  . Alcohol use: Never  . Drug use: Never  . Sexual activity: Yes    Birth control/protection: None   Other Topics Concern  . 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 Difficulty of Paying Living Expenses: Not hard at all  Food Insecurity: No Food Insecurity  . Worried About RunnCharity fundraiserthe Last Year: Never true  . Ran Out of Food in the Last Year: Never true  Transportation Needs: No Transportation Needs  . Lack of Transportation (Medical): No  . Lack of Transportation (Non-Medical): No  Physical Activity: Sufficiently Active  . Days of Exercise per Week: 4 days  . Minutes of Exercise per Session: 60 min  Stress: No Stress Concern Present  . Feeling of Stress : Only a little  Social Connections:   . Frequency of Communication with Friends and Family: Not on file  . Frequency of Social Gatherings with Friends and Family: Not on file  . Attends Religious Services: Not on file  . Active Member of Clubs or Organizations: Not on file  . Attends ClubArchivisttings: Not on file  . Marital Status: Not on file    Family History: History reviewed.  No pertinent family history.  Allergies: No Known Allergies  Medications Prior to Admission  Medication Sig Dispense Refill Last Dose  . FreeStyle Unistick II Lancets MISC 1 % by Does not apply route QID.   Past Week at Unknown time  . glucose monitoring kit (FREESTYLE) monitoring kit 1 each by Does not apply route as needed for other.   Past Week at Unknown time  . prenatal vitamin w/FE, FA (PRENATAL 1 + 1) 27-1 MG TABS tablet Take 1 tablet by mouth daily at 12 noon.   Past Week at Unknown time  . Accu-Chek FastClix Lancets MISC 1 Units by Percutaneous route 4 (four) times daily. (Patient not taking: Reported on 02/04/2019) 100 each 12   . Blood Glucose Monitoring Suppl (ACCU-CHEK GUIDE) w/Device KIT 1 Device by Does not apply route 4 (four) times daily.  (Patient not taking: Reported on 02/04/2019) 1 kit 0   . glucose blood (ACCU-CHEK GUIDE) test strip Use to check blood sugars four times a day was instructed (Patient not taking: Reported on 02/04/2019) 50 each 12      Review of Systems   All systems reviewed and negative except as stated in HPI  Blood pressure 117/68, pulse 79, temperature 97.6 F (36.4 C), temperature source Oral, resp. rate 18, height '5\' 5"'  (1.651 m), weight 68.9 kg, last menstrual period 07/12/2018, SpO2 97 %. General appearance: alert, cooperative, appears stated age and no distress Lungs: normal effort Heart: regular rate  Abdomen: soft, non-tender; bowel sounds normal Pelvic: gravid uterus GU: No vaginal lesions  Extremities: Homans sign is negative, no sign of DVT DTR's intact Presentation: cephalic Fetal monitoringBaseline: 120 bpm, Variability: Good {> 6 bpm) and Accelerations: Reactive Uterine activity: Frequency: Every 3 minutes Dilation: 7 Effacement (%): 100 Station: -1 Exam by:: Philis Pique, RN   Prenatal labs: ABO, Rh: --/--/A POS (01/15 0755) Antibody: NEG (01/15 0755) Rubella: 4.33 (07/23 1625) RPR: Non Reactive (11/12 0843)  HBsAg: Negative (07/23 1625)  HIV: Non Reactive (11/12 0843)  GBS:   Positive by UCx 2 hr Glucola 151 Genetic screening  NIPS low risk female, AFP negative Anatomy US bilateral UTD  Prenatal Transfer Tool  Maternal Diabetes: Yes:  Diabetes Type:  Diet controlled Genetic Screening: Normal Maternal Ultrasounds/Referrals: Fetal Kidney Anomalies Fetal Ultrasounds or other Referrals:  Referred to Materal Fetal Medicine  Maternal Substance Abuse:  No Significant Maternal Medications:  None Significant Maternal Lab Results: Group B Strep positive  Results for orders placed or performed during the hospital encounter of 04/03/19 (from the past 24 hour(s))  Respiratory Panel by RT PCR (Flu A&B, Covid) - Nasopharyngeal Swab   Collection Time: 04/03/19  7:48 AM    Specimen: Nasopharyngeal Swab  Result Value Ref Range   SARS Coronavirus 2 by RT PCR NEGATIVE NEGATIVE   Influenza A by PCR NEGATIVE NEGATIVE   Influenza B by PCR NEGATIVE NEGATIVE  CBC   Collection Time: 04/03/19  7:55 AM  Result Value Ref Range   WBC 11.3 (H) 4.0 - 10.5 K/uL   RBC 4.37 3.87 - 5.11 MIL/uL   Hemoglobin 13.7 12.0 - 15.0 g/dL   HCT 39.8 36.0 - 46.0 %   MCV 91.1 80.0 - 100.0 fL   MCH 31.4 26.0 - 34.0 pg   MCHC 34.4 30.0 - 36.0 g/dL   RDW 13.1 11.5 - 15.5 %   Platelets 172 150 - 400 K/uL   nRBC 0.0 0.0 - 0.2 %  Type and screen [place]  Collection Time: 04/03/19  7:55 AM  Result Value Ref Range   ABO/RH(D) A POS    Antibody Screen NEG    Sample Expiration      04/06/2019,2359 Performed at [facility], [place] [address], [place], [place] [id number]     Patient Active Problem List   Diagnosis Date Noted  . Normal labor 04/03/2019  . Gestational diabetes mellitus (GDM) in third trimester 01/30/2019  . Bilateral mild fetal urinary tract dilation, antepartum 11/29/2018  . Group B streptococcal carriage in urine complicating pregnancy 21/94/7125  . Supervision of high-risk pregnancy 10/09/2018    Assessment/Plan:  Jordan Brown is a 33 y.o. G2P0010 at 24w6dhere for active labor.  #Labor: No augmentation at this time, will administer GBS prophylaxis. Plan to rupture membranes if does not happen spontaneously once ppx achieved.  #GDM- CBG q2h check while in active labor.  #UTD- Fetus with mild bilateral renal pelvis enlargement at 19w, unilateral at 26 weeks and mild, low risk categorization. Per algorithm below, infant will need referral for UKoreaat 45hours-1 mo of age and at 290743months of age.  NAlfonse Spruceet al.  Multidisciplenary consensus on the classification of prenatal  and postnatal urinary tract dilation (UTD classification system).  Journal of Pediatric Urology (2014), 10, [phone]  #Pain: Per patient request #FWB: Cat I; EFW:  2700g #ID:  GBS (+), ampicillin ordered to be followed by penicillin #MOF: breast #MOC: natural family planning #Circ:  n/a  CGladys Damme MD [facility] PGY-1 04/03/2019, 9:39 AM    OB FELLOW ATTESTATION  I have seen and examined this patient and have edited the above note to reflect any changes or updates.  Patient also requests female providers, discussed I would need to be involved in case of emergency or urgent delivery, otherwise will ask MAU CNM to attend delivery if available.   MAugustin Coupe MD/MPH OB Fellow  04/03/2019, 9:46 AM

## 2019-04-03 NOTE — Progress Notes (Signed)
Labor Progress Note Jordan Brown is a 34 y.o. G2P0010 at [redacted]w[redacted]d presented for SOL, GDM S:  Patient and fetus tolerating position very well, epidural remains adequate.  O:  BP 122/71   Pulse 76   Temp 98.3 F (36.8 C) (Oral)   Resp 18   Ht 5\' 5"  (1.651 m)   Wt 68.9 kg   LMP 07/12/2018 (Exact Date)   SpO2 97%   BMI 25.28 kg/m  EFM: 130 bpm/+accels/+1 variable  CVE: Dilation: 9 Effacement (%): 100 Cervical Position: Middle Station: Plus 1 Presentation: Vertex Exam by:: Dr Chauncey Reading   A&P: 34 y.o. G2P0010 [redacted]w[redacted]d here for SOL, GDM. #Labor: About 4 hours with same exam, still bulging bag of water, fetal head well applied to cervix. Discussed importance of augmentation as labor has not changed in ~4 hours. Recommended both AROM and pitocin, as bulging bag present and ctx are spaced at about q5-7 minutes. Low risk for both interventions at this time. Patient and FOB acknowledge low risk of augmentation methods with benefit being end to labor, but they would still prefer a conservative approach at this point. Shared decision making with family for now is to change position to [facility] and recheck at 4 PM. Discussed with patient and FOB that if vitals become unstable, or decelerations on EFM occur, recommend AROM/pit to hasten labor. They expressed understanding with these plans. #Pain: Epidural #FWB: Cat I #GBS positive, completed first 4 hour amp ppx, now PCN ppx #GDM: last CBG 90 Gladys Damme, MD 3:09 PM

## 2019-04-03 NOTE — Lactation Note (Signed)
This note was copied from a baby's chart. Lactation Consultation Note  Patient Name: Girl Devonya Nees [id number] Date: 04/03/2019 Reason for consult: Initial assessment  2245 - I conducted an initial lactation consult with Ms. Cashin. Her RN was in the room doing an assessment on baby. Ms. Panzer reports that baby Neita Garnet has latched since delivery. She would like latch assistance later. Multiple providers in the room at this time; I encouraged her to call lactation for help tonight, and she verbalized understanding.   Consult Status Consult Status: Follow-up Date: 04/04/19 Follow-up type: In-patient    Lenore Manner 04/03/2019, 11:01 PM

## 2019-04-04 DIAGNOSIS — O139 Gestational [pregnancy-induced] hypertension without significant proteinuria, unspecified trimester: Secondary | ICD-10-CM

## 2019-04-04 HISTORY — DX: Gestational (pregnancy-induced) hypertension without significant proteinuria, unspecified trimester: O13.9

## 2019-04-04 LAB — GLUCOSE, RANDOM: Glucose, Bld: 106 mg/dL — ABNORMAL HIGH (ref 70–99)

## 2019-04-04 MED ORDER — POLYETHYLENE GLYCOL 3350 17 G PO PACK
17.0000 g | PACK | Freq: Every day | ORAL | Status: DC
  Administered 2019-04-04 – 2019-04-05 (×2): 17 g via ORAL
  Filled 2019-04-04 (×2): qty 1

## 2019-04-04 NOTE — Progress Notes (Addendum)
Post Partum Day 1 Subjective: Patient reports feeling well. She is tolerating PO. Ambulating and urinating without difficulty. Lochia minimal.   Objective: Blood pressure 114/90, pulse (!) 114, temperature 97.7 F (36.5 C), temperature source Axillary, resp. rate 17, height 5\' 5"  (1.651 m), weight 68.9 kg, last menstrual period 07/12/2018, SpO2 100 %, unknown if currently breastfeeding.  Physical Exam:  General: alert, cooperative and appears stated age 34: appropriate Uterine Fundus: firm DVT Evaluation: No evidence of DVT seen on physical exam.  Recent Labs    04/03/19 0755  HGB 13.7  HCT 39.8    Assessment/Plan: Plan for discharge tomorrow  Breastfeeding Vitals stable; mild tachycardia to 114 this morning but previously WNL Declines birth control Fasting glucose at 0200 this morning 106 (unsure when patient had last eaten); GTT outpatient regardless Few elevated BP's > 4 hours apart intrapartum; now WNL. Does meet criteria for gHTN. Will continue to monitor.    LOS: 1 day   Chauncey Mann 04/04/2019, 6:05 AM

## 2019-04-04 NOTE — Lactation Note (Signed)
This note was copied from a baby's chart. Lactation Consultation Note  Patient Name: Jordan Brown [id number] Date: 04/04/2019 Reason for consult: Follow-up assessment;Mother's request P1, 5 hour female, ETI infant. 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 insurance sheet given mom will let [place] services know which DEBP she will choose.  Per mom, infant latched well in L&D breastfeed for 45 minutes. Infant sleepy breastfeed for 5 minutes, infant was still cuing to breastfeed when [facility] entered room. Mom latched infant on left breast using the cross cradle hold, infant breast feed off and on for 14 minutes. LC discussed ways to keep infant awake and stimulated to breastfeed. LC discussed possible supplementation with donor breast milk mom prefers to breastfeed infant and give infant back her EBM at this time. Mom knows to breastfeed according infant feeding to hunger  cues, 8 to 12 times within 24 hours and on demand. Parents will continue to do as much STS as possible. Mom will use DEBP every 3 hours for 15 minutes on initial setting. Mom shown how to use DEBP & how to disassemble, clean, & reassemble parts. Mom knows to call RN or LC if she has any questions, concerns or need assistance with latching infant at breast. Reviewed Baby & Me book's Breastfeeding Basics.  Mom made aware of O/P services, breastfeeding support groups, community resources, and our phone # for post-discharge questions.   Maternal Data Formula Feeding for Exclusion: No Has patient been taught Hand Expression?: Yes Does the patient have breastfeeding experience prior to this delivery?: No  Feeding Feeding Type: Breast Fed  LATCH Score Latch: Repeated attempts needed to sustain latch, nipple held in mouth throughout feeding, stimulation needed to elicit sucking reflex.  Audible Swallowing: A few with stimulation  Type of Nipple: Everted at rest and after stimulation  Comfort (Breast/Nipple): Soft /  non-tender  Hold (Positioning): Assistance needed to correctly position infant at breast and maintain latch.  LATCH Score: 7  Interventions Interventions: Breast feeding basics reviewed;Breast compression;Assisted with latch;Adjust position;Skin to skin;Support pillows;Breast massage;Position options;Hand express;Expressed milk;DEBP  Lactation Tools Discussed/Used WIC Program: No Pump Review: Setup, frequency, and cleaning;Milk Storage Initiated by:: Vicente Serene, IBCLC   Consult Status Consult Status: Follow-up Date: 04/04/19 Follow-up type: In-patient    Vicente Serene 04/04/2019, 1:12 AM

## 2019-04-04 NOTE — Lactation Note (Signed)
This note was copied from a baby's chart. Lactation Consultation Note  Patient Name: Jordan Brown [id number] Date: 04/04/2019 Reason for consult: Follow-up assessment;Mother's request;Difficult latch;Early term 37-38.6wks;Infant weight loss P1, 27 hour female infant. Per mom, earlier today  with last two feedings she has been having discomfort when infant latches she has been feeling pinching at breast with latch. Infant does has very strong suck and does not open mouth wide. [place] dicussed with parents ask Pediatrician to examine infant oral mouth cavity.  Due to mom's discomfort with latch, 24 mm NS used, per mom, she could feel difference with latch it felt better using 24 mm NS. [place] worked with Mom on latch infant at breast and infant opening  her mouth wide to  extending lower jaw downward. Mom latched infant on left breast using the cross cradle hold position, infant was  supplemented with 9 mls of EBM using a curve tip syringe. Per mom, this was the longest infant has breastfed and sustained latch, infant breastfed for  20 minutes.  Mom will continue working on latching infant at breast using 24 mm NS.  Mom will continue using DEBP every 3 hours for 15 minutes on initial setting and supplementing infant with her EBM, mom will supplement with curve tip syringe at breast or offer EBM in curve tip syringe after latching infant at breast. Mom knows to call RN or [place] if she has questions or need assistance with latching infant at breast. Mom was give comfort gels due mild abrasion on right breast, mom understands not to use with coconut oil and wear in her bra.  Mom was doing STS with infant as [place] left the room.  Mom will continue to breastfeed according huger cues, 8 to 12 times within 24 hours and on demand and will not exceed 3 hours without feeding infant.  Maternal Data    Feeding Feeding Type: Breast Fed  LATCH Score Latch: Grasps breast easily, tongue down, lips flanged,  rhythmical sucking.  Audible Swallowing: Spontaneous and intermittent  Type of Nipple: Everted at rest and after stimulation  Comfort (Breast/Nipple): Filling, red/small blisters or bruises, mild/mod discomfort  Hold (Positioning): Assistance needed to correctly position infant at breast and maintain latch.  LATCH Score: 8  Interventions Interventions: Skin to skin;Support pillows;Adjust position;Breast compression;Position options;Expressed milk;Assisted with latch  Lactation Tools Discussed/Used Tools: Nipple Shields Nipple shield size: 20   Consult Status Consult Status: Follow-up Date: 04/05/19 Follow-up type: In-patient    Vicente Serene 04/04/2019, 11:05 PM

## 2019-04-04 NOTE — Anesthesia Postprocedure Evaluation (Signed)
Anesthesia Post Note  Patient: [name]  Procedure(s) Performed: AN AD [place]     Patient location during evaluation: Mother Baby Anesthesia Type: Epidural Level of consciousness: awake and alert and oriented Pain management: satisfactory to patient Vital Signs Assessment: post-procedure vital signs reviewed and stable Respiratory status: spontaneous breathing and nonlabored ventilation Cardiovascular status: stable Postop Assessment: no headache, no backache, no signs of nausea or vomiting, adequate PO intake, patient able to bend at knees, able to ambulate and no apparent nausea or vomiting (patient up walking) Anesthetic complications: no    Last Vitals:  Vitals:   04/03/19 2250 04/04/19 0210  BP: 117/86 114/90  Pulse: 96 (!) 114  Resp: 17   Temp: 36.8 C 36.5 C  SpO2:  100%    Last Pain:  Vitals:   04/04/19 0210  TempSrc: Axillary  PainSc: 2    Pain Goal:                   Jordan Brown

## 2019-04-05 MED ORDER — WITCH HAZEL-GLYCERIN EX PADS: 1.0000 "application " | MEDICATED_PAD | CUTANEOUS | 12 refills | Status: DC | PRN

## 2019-04-05 MED ORDER — SENNOSIDES-DOCUSATE SODIUM 8.6-50 MG PO TABS: 2.0000 | ORAL_TABLET | ORAL | 0 refills | Status: DC

## 2019-04-05 MED ORDER — POLYETHYLENE GLYCOL 3350 17 G PO PACK: 17.0000 g | PACK | Freq: Every day | ORAL | 0 refills | Status: DC

## 2019-04-05 MED ORDER — IBUPROFEN 600 MG PO TABS: 600.0000 mg | ORAL_TABLET | Freq: Four times a day (QID) | ORAL | 0 refills | Status: DC

## 2019-04-05 MED ORDER — ACETAMINOPHEN 325 MG PO TABS: 650.0000 mg | ORAL_TABLET | ORAL | 0 refills | Status: DC | PRN

## 2019-04-05 NOTE — Lactation Note (Signed)
This note was copied from a baby's chart. Lactation Consultation Note  Patient Name: Jordan Brown [id number] Date: 04/05/2019 Reason for consult: Follow-up assessment;Primapara;1st time breastfeeding;Nipple pain/trauma;Early term 37-38.6wks;Infant weight loss Baby is 60 hours old / 7 % weight loss  As LC entered the room baby asleep in the crib , mom up and walking in the room and dad sitting on the couch.  LC reviewed the doc flow sheets with mom and dad/ and dad updated from the yellow  I/O sheet. Baby last fed at 7 am for 20 mins.  Per mom having some nipple discomfort and was given a NS last night, but has not used it for all feedings. The right is more tender than the left. LC offered to assess and  Noted areola edema at the base of both nipples and recommended prior to every feeding - breast massage, hand express, pre-pump with hand pump if needed and reverse pressure to enhance depth. Comfort gels after feedings or pumping alternating with  Shells between feedings.  Due to weight loss of 7 % and less than 6 pounds after 5-6 feedings a day post pump 15 -20 mins for milk to supplement baby.  LC recommended once mom has milk to feed the baby 15 -20 mins ( 30 mins max ) and supplement EBM with Medium based Dr. Owens Shark nipple.  Mom has a #24 NS if needed and curved tip syringe. Sore nipple and engorgement prevention and tx reviewed.  Mom has hand pump with instructions and the #24 F is a good fit.  DEBP - [place] for home.  Right nipple on the front portion has reddened area/ no break down noted.  Mom has been given shells ( has not used as of yet ) LC reviewed how to use them and put them together for mom.  @ this consult mom requested to try the side lying position before going home.  LC assisted on the left breast 1st and initially mom having some discomfort and once LC eased the baby's chin down and helped with breast compressions , comfort achieved and baby fed for 15 mins. LC showed  mom how to take the baby off the breast and nipple was well rounded except the lower portion slightly slanted.  LC assisted to latch on the right breast / side lying / and the baby latched with depth/  And at 1st per mom was feeling discomfort, after breast compressions pain less and mom requested to release baby. LC massage breast , prepumped with the hand pump , with drops of colostrum and relatched with wide open mouth and increased depth and per mom more comfortable. Baby still feeding at 12 mins.   Mom is aware of the [place] resources after D/C.     Maternal Data Has patient been taught Hand Expression?: Yes  Feeding Feeding Type: Breast Fed  LATCH Score Latch: Grasps breast easily, tongue down, lips flanged, rhythmical sucking.  Audible Swallowing: Spontaneous and intermittent  Type of Nipple: Everted at rest and after stimulation  Comfort (Breast/Nipple): Filling, red/small blisters or bruises, mild/mod discomfort  Hold (Positioning): Assistance needed to correctly position infant at breast and maintain latch.  LATCH Score: 8  Interventions Interventions: Breast feeding basics reviewed  Lactation Tools Discussed/Used Tools: Shells;Pump;Comfort gels;Coconut oil;Nipple Shields Nipple shield size: 24 Shell Type: Inverted Breast pump type: Manual;Double-Electric Breast Pump WIC Program: No Pump Review: Milk Storage;Setup, frequency, and cleaning   Consult Status Consult Status: Complete Date: 04/05/19 Follow-up type: In-patient  Jerlyn Ly Shashwat Cleary 04/05/2019, 10:12 AM

## 2019-04-06 ENCOUNTER — Other Ambulatory Visit: Payer: Self-pay | Admitting: *Deleted

## 2019-04-06 ENCOUNTER — Telehealth: Payer: Self-pay | Admitting: Radiology

## 2019-04-06 NOTE — Patient Outreach (Signed)
[place] [facility]) Care Management  04/06/2019  Jordan Brown 1985-03-24 [id number]  Transition of care telephone call Insurance: [place]  Jordan Brown a member of [place] [facility] Diabetes During Pregnancy Programprogram. Her due date was 04/18/19. She was admitted to [place] [facility] [place] on 04/03/19 in active labor and delivered a baby girl without complications per the discharge summary. She and baby were discharged on 04/05/19.  Initial unsuccessful telephone call to Jordan Brown's preferred number in order to complete transition of care assessment; no answer, left HIPAA compliant voicemail message requesting return call.   Plan: This RNCM will attempt another outreach within 4 business days.  Barrington Ellison Pollock Management Coordinator Office Phone [phone] Office Fax [phone]

## 2019-04-06 NOTE — Telephone Encounter (Signed)
Left message for patient to call [facility] to scheduled postpartum appointment with GTT.

## 2019-04-08 ENCOUNTER — Telehealth: Payer: No Typology Code available for payment source | Admitting: Advanced Practice Midwife

## 2019-04-08 ENCOUNTER — Encounter: Payer: Self-pay | Admitting: *Deleted

## 2019-04-08 ENCOUNTER — Other Ambulatory Visit: Payer: Self-pay | Admitting: *Deleted

## 2019-04-08 NOTE — Patient Outreach (Signed)
[facility]) Care Management  Jordan Brown  04/08/2019   Jordan Brown 11-30-1985 [id number]   Varnitais a member of [place] [facility] Diabetes During Pregnancy Programprogram. She was diagnosed with gestational diabetes on 01/29/19. Her due date was 04/18/19. She was admitted to [facility] [facility] [place] on 04/03/19 in active labor and delivered a baby girl without complications per the discharge summary. She and baby were discharged to home on 04/05/19. Returned call to [place] after she left message on 04/07/19 at 2:49 pm returning call to this RNCM.  Subjective:  Spoke with Jordan Brown to complete transition of care assessment. She says her baby girl weighed 5 lbs 14 ozs at birth and that she took her to the pediatrician today and she weighed only slightly less than her birth weight. She says she is not requiring any over the counter pain medicine for perineal pain. She says the baby is breastfeeding well. In addition to her husband's help, she says her in laws live nearby and they are also helping. Jordan Brown says she will call and make a follow up appointment with her OB MD and that they told her she would likely have another oral glucose tolerance test to ensure blood glucose has normalized at her post partum visit.   Objective: n/a  Encounter Medications:  Outpatient Encounter Medications as of 04/08/2019  Medication Sig  . Prenatal Vit-Fe Fumarate-FA (PRENATAL MULTIVITAMIN) TABS tablet Take 1 tablet by mouth daily at 12 noon.  Marland Kitchen acetaminophen (TYLENOL) 325 MG tablet Take 2 tablets (650 mg total) by mouth every 4 (four) hours as needed (for pain scale < 4). (Patient not taking: Reported on 04/08/2019)  . ibuprofen (ADVIL) 600 MG tablet Take 1 tablet (600 mg total) by mouth every 6 (six) hours. (Patient not taking: Reported on 04/08/2019)  . polyethylene glycol (MIRALAX / GLYCOLAX) 17 g packet Take 17 g by mouth daily. (Patient not  taking: Reported on 04/08/2019)  . senna-docusate (SENOKOT-S) 8.6-50 MG tablet Take 2 tablets by mouth daily. (Patient not taking: Reported on 04/08/2019)  . witch hazel-glycerin (TUCKS) pad Apply 1 application topically as needed for hemorrhoids. (Patient not taking: Reported on 04/08/2019)   No facility-administered encounter medications on file as of 04/08/2019.    Functional Status:  In your present state of health, do you have any difficulty performing the following activities: 04/08/2019 04/03/2019  Hearing? N N  Vision? N N  Difficulty concentrating or making decisions? N N  Walking or climbing stairs? N N  Dressing or bathing? N N  Doing errands, shopping? N N  Preparing Food and eating ? N -  Using the Toilet? N -  In the past six months, have you accidently leaked urine? N -  Do you have problems with loss of bowel control? N -  Managing your Medications? N -  Managing your Finances? N -  Housekeeping or managing your Housekeeping? N -  Some recent data might be hidden    Fall/Depression Screening: Fall Risk  04/08/2019  Falls in the past year? 0   PHQ 2/9 Scores 02/04/2019  PHQ - 2 Score 0    Assessment:  Mom and baby doing well. Blood sugar variance during hospitalization 90-125. She is knowledgeable about strategies to stay safe during the Covid pandemic.   Plan:  Discussed increased risk of developing Type 2 DM with diagnosis of gestational diabetes. Referred [place] to [facility] for any future chronic disease management issues. Thanked her for  her services to [facility] and wished her and the baby well.  Will close case to [place] Management services and to the [place] Management Diabetes During Pregnancy Program.  Barrington Ellison Lorenzo Management Coordinator Office Phone [phone] Office Fax [phone]

## 2019-04-13 ENCOUNTER — Encounter: Payer: Self-pay | Admitting: Internal Medicine

## 2019-04-13 ENCOUNTER — Encounter: Payer: No Typology Code available for payment source | Admitting: Obstetrics & Gynecology

## 2019-04-17 ENCOUNTER — Other Ambulatory Visit: Payer: Self-pay | Admitting: *Deleted

## 2019-04-17 MED ORDER — FLUCONAZOLE 150 MG PO TABS: 150.0000 mg | ORAL_TABLET | Freq: Once | ORAL | 0 refills | Status: AC

## 2019-04-17 MED ORDER — CEFADROXIL 500 MG PO CAPS: 500.0000 mg | ORAL_CAPSULE | Freq: Two times a day (BID) | ORAL | 0 refills | Status: AC

## 2019-04-18 ENCOUNTER — Inpatient Hospital Stay (HOSPITAL_COMMUNITY): Admission: AD | Admit: 2019-04-18 | Payer: No Typology Code available for payment source | Source: Home / Self Care

## 2019-05-13 ENCOUNTER — Encounter: Payer: Self-pay | Admitting: Family Medicine

## 2019-05-13 ENCOUNTER — Ambulatory Visit (INDEPENDENT_AMBULATORY_CARE_PROVIDER_SITE_OTHER): Payer: No Typology Code available for payment source | Admitting: Family Medicine

## 2019-05-13 ENCOUNTER — Other Ambulatory Visit: Payer: Self-pay

## 2019-05-13 VITALS — BP 103/68 | HR 73 | Wt 127.0 lb

## 2019-05-13 DIAGNOSIS — O135 Gestational [pregnancy-induced] hypertension without significant proteinuria, complicating the puerperium: Secondary | ICD-10-CM

## 2019-05-13 DIAGNOSIS — Z1332 Encounter for screening for maternal depression: Secondary | ICD-10-CM

## 2019-05-13 DIAGNOSIS — O24430 Gestational diabetes mellitus in the puerperium, diet controlled: Secondary | ICD-10-CM

## 2019-05-13 DIAGNOSIS — O133 Gestational [pregnancy-induced] hypertension without significant proteinuria, third trimester: Secondary | ICD-10-CM

## 2019-05-13 DIAGNOSIS — O24410 Gestational diabetes mellitus in pregnancy, diet controlled: Secondary | ICD-10-CM

## 2019-05-13 NOTE — Progress Notes (Signed)
Post Partum Exam  Jordan Brown is a 34 y.o. G81P1011 female who presents for a postpartum visit. She is 6 weeks postpartum following a spontaneous vaginal delivery. I have fully reviewed the prenatal and intrapartum course. The delivery was at 37.6 gestational weeks.  Anesthesia: epidural. Postpartum course has been uncomplicated. Baby's course has been uncomplicated. Baby is feeding by breast via pumping and bottle. Bleeding staining only. Bowel function is normal. Bladder function is normal. Patient is not sexually active. Contraception method is none. Postpartum depression screening:neg  The following portions of the patient's history were reviewed and updated as appropriate: allergies, current medications, past family history, past medical history, past social history, past surgical history and problem list. Last pap smear done 2018 and was Normal  Review of Systems Pertinent items are noted in HPI.    Objective:  Blood pressure 103/68, pulse 73, weight 127 lb (57.6 kg), currently breastfeeding.  General:  alert, cooperative and appears stated age   Breasts:  inspection negative, no nipple discharge or bleeding, no masses or nodularity palpable- left nipple with some evidence of healing from nipple trauma.   Lungs: normal WOB  Heart:  RR  Abdomen: soft, non-tender; bowel sounds normal; no masses,  no organomegaly   Vulva:  normal  Vagina: normal vagina and evidence of non-disolved suture at entroitus and peri-urethral. Cut knot and removed  Cervix:  not evaluated  Corpus: not examined  Adnexa:  not evaluated  Rectal Exam: Not performed.        Assessment:    Normal postpartum exam. Pap smear not done at today's visit.   Plan:   1. Contraception: natural family planning/condom 2. Mood- neg 3. Feeding: currently exclusively pumping. Reviewed pumping schedule. Currently getting 10-13 oz every 6-8 hours. Reviewed hormonal control of lactation until ~12 wk and then stimulation  based. Recommended adjusting to q 4 hours if supply drops. Reviewed supplements to boost suppy-- specifically fenugreek and sunflower lechithin.   4. Chronic medical - gHTN- BP WNL today - GDM- 2 hr GTT today   Follow up in:  1 year or as needed.

## 2019-05-14 LAB — GLUCOSE TOLERANCE, 2 HOURS
Glucose, 2 hour: 104 mg/dL (ref 65–139)
Glucose, GTT - Fasting: 72 mg/dL (ref 65–99)

## 2019-08-18 ENCOUNTER — Other Ambulatory Visit: Payer: Self-pay

## 2019-08-20 ENCOUNTER — Ambulatory Visit (INDEPENDENT_AMBULATORY_CARE_PROVIDER_SITE_OTHER): Payer: No Typology Code available for payment source | Admitting: Internal Medicine

## 2019-08-20 ENCOUNTER — Encounter: Payer: Self-pay | Admitting: Internal Medicine

## 2019-08-20 ENCOUNTER — Other Ambulatory Visit: Payer: Self-pay

## 2019-08-20 VITALS — BP 120/80 | HR 71 | Temp 97.7°F | Ht 64.69 in | Wt 127.4 lb

## 2019-08-20 DIAGNOSIS — Z13220 Encounter for screening for lipoid disorders: Secondary | ICD-10-CM

## 2019-08-20 DIAGNOSIS — Z1329 Encounter for screening for other suspected endocrine disorder: Secondary | ICD-10-CM

## 2019-08-20 DIAGNOSIS — L659 Nonscarring hair loss, unspecified: Secondary | ICD-10-CM

## 2019-08-20 DIAGNOSIS — Z1159 Encounter for screening for other viral diseases: Secondary | ICD-10-CM

## 2019-08-20 DIAGNOSIS — E559 Vitamin D deficiency, unspecified: Secondary | ICD-10-CM

## 2019-08-20 DIAGNOSIS — O24419 Gestational diabetes mellitus in pregnancy, unspecified control: Secondary | ICD-10-CM | POA: Diagnosis not present

## 2019-08-20 DIAGNOSIS — Z0000 Encounter for general adult medical examination without abnormal findings: Secondary | ICD-10-CM | POA: Insufficient documentation

## 2019-08-20 DIAGNOSIS — Z1389 Encounter for screening for other disorder: Secondary | ICD-10-CM

## 2019-08-20 DIAGNOSIS — E611 Iron deficiency: Secondary | ICD-10-CM

## 2019-08-20 DIAGNOSIS — Z0184 Encounter for antibody response examination: Secondary | ICD-10-CM

## 2019-08-20 DIAGNOSIS — R739 Hyperglycemia, unspecified: Secondary | ICD-10-CM

## 2019-08-20 NOTE — Patient Instructions (Addendum)
Talk to ob/gyn about HPV vaccine, vaginal dryness you can try Replens OTC and [place] water based jelly  Ear wax drops Debrox  ?pap

## 2019-08-21 ENCOUNTER — Encounter: Payer: Self-pay | Admitting: Internal Medicine

## 2019-08-21 DIAGNOSIS — E559 Vitamin D deficiency, unspecified: Secondary | ICD-10-CM | POA: Insufficient documentation

## 2019-08-21 NOTE — Progress Notes (Signed)
Chief Complaint  Patient presents with   Annual Exam   Annual doing well  1. C/o h/o mastitis and vaginal dryness since had a baby 4 months ago and hemorrhoids defer mastitis resolved and vaginal dryness to ob/gyn  2. Vit D def  3. H/o blood in urine will recheck lmp 07/12/2018  4. H/o gestational DM  5. She does c/o hair loss today since delivery as well    Review of Systems  Constitutional: Negative for weight loss.  HENT: Negative for hearing loss.   Eyes: Negative for blurred vision.  Respiratory: Negative for shortness of breath.   Cardiovascular: Negative for chest pain.  Gastrointestinal: Negative for abdominal pain.  Genitourinary: Positive for hematuria.       History  Musculoskeletal: Negative for falls.  Skin: Negative for rash.       +hair loss  Neurological: Negative for headaches.  Psychiatric/Behavioral: Negative for depression.   Past Medical History:  Diagnosis Date   Gestational diabetes    Gestational hypertension 04/04/2019   Hemorrhoids    Mastitis    Vitamin D deficiency    Past Surgical History:  Procedure Laterality Date   crown     ROOT CANAL     WISDOM TOOTH EXTRACTION     Family History  Problem Relation Age of Onset   Hypertension Mother    Hypertension Father    Hyperlipidemia Father    Endometriosis Sister    Social History   Socioeconomic History   Marital status: Married    Spouse name: Shekera Beavers   Number of children: 1   Years of education: Not on file   Highest education level: Professional school degree (e.g., MD, DDS, DVM, JD)  Occupational History   Occupation: Naval architect: [employer]    Comment: [employer]  Tobacco Use   Smoking status: Never Smoker   Smokeless tobacco: Never Used  Substance and Sexual Activity   Alcohol use: Never   Drug use: Never   Sexual activity: Yes    Birth control/protection: None  Other Topics Concern   Not on file  Social History Narrative    Patient is a GI physician at [employer]   Lives with husband and 08/20/19 65 month old daughter Collier Bullock up in [place]   2 siblings   Social Determinants of Health   Financial Resource Strain: Low Risk    Difficulty of Paying Living Expenses: Not hard at all  Food Insecurity: No Food Insecurity   Worried About Charity fundraiser in the Last Year: Never true   Arboriculturist in the Last Year: Never true  Transportation Needs: No Transportation Needs   Lack of Transportation (Medical): No   Lack of Transportation (Non-Medical): No  Physical Activity: Sufficiently Active   Days of Exercise per Week: 4 days   Minutes of Exercise per Session: 60 min  Stress: No Stress Concern Present   Feeling of Stress : Only a little  Social Connections: Unknown   Frequency of Communication with Friends and Family: More than three times a week   Frequency of Social Gatherings with Friends and Family: Not on file   Attends Religious Services: Not on file   Active Member of Clubs or Organizations: Not on file   Attends Archivist Meetings: Not on file   Marital Status: Married  Intimate Partner Violence: Not At Risk   Fear of Current or Ex-Partner: No   Emotionally Abused: No  Physically Abused: No   Sexually Abused: No   Current Meds  Medication Sig   Prenatal Vit-Fe Fumarate-FA (PRENATAL MULTIVITAMIN) TABS tablet Take 1 tablet by mouth daily at 12 noon.   No Known Allergies No results found for this or any previous visit (from the past 2160 hour(s)). Objective  Body mass index is 21.41 kg/m. Wt Readings from Last 3 Encounters:  08/20/19 127 lb 6.4 oz (57.8 kg)  05/13/19 127 lb (57.6 kg)  04/03/19 151 lb 14.4 oz (68.9 kg)   Temp Readings from Last 3 Encounters:  08/20/19 97.7 F (36.5 C) (Temporal)  04/05/19 98.4 F (36.9 C) (Oral)  03/25/19 97.8 F (36.6 C)   BP Readings from Last 3 Encounters:  08/20/19 120/80  05/13/19 103/68  04/05/19 113/86    Pulse Readings from Last 3 Encounters:  08/20/19 71  05/13/19 73  04/05/19 72    Physical Exam Vitals and nursing note reviewed.  Constitutional:      Appearance: Normal appearance. She is well-developed and well-groomed.  HENT:     Head: Normocephalic and atraumatic.  Eyes:     Conjunctiva/sclera: Conjunctivae normal.     Pupils: Pupils are equal, round, and reactive to light.  Cardiovascular:     Rate and Rhythm: Normal rate and regular rhythm.     Heart sounds: Normal heart sounds. No murmur.  Pulmonary:     Effort: Pulmonary effort is normal.     Breath sounds: Normal breath sounds.  Skin:    General: Skin is warm and dry.  Neurological:     General: No focal deficit present.     Mental Status: She is alert and oriented to person, place, and time. Mental status is at baseline.     Gait: Gait normal.  Psychiatric:        Attention and Perception: Attention and perception normal.        Mood and Affect: Mood and affect normal.        Speech: Speech normal.        Behavior: Behavior normal. Behavior is cooperative.        Thought Content: Thought content normal.        Cognition and Memory: Cognition and memory normal.        Judgment: Judgment normal.     Assessment  Plan  Annual physical exam -  Fasting labs [facility] Flu shot utd  Tdap utd  covid 2/2  Consider HPV vaccine if not had  F/u ob/gyn pap when due no h/o abnormal disc when due with ob/gyn  mammo age 68  Colonoscopy 45  Skin normal  PHQ 9 0 and GAD 7 0 today  rec healthy diet and exercise   Fasting labs [facility] check hep B titer last 6   Gestational diabetes mellitus (GDM), antepartum, gestational diabetes method of control unspecified - Plan: Hemoglobin A1c  Hair loss - Plan: TSH W/u with labs  Disc tea tree shampoo for now   Provider: Dr. Olivia Mackie McLean-Scocuzza-Internal Medicine

## 2019-08-24 ENCOUNTER — Encounter: Payer: Self-pay | Admitting: Internal Medicine

## 2019-08-24 NOTE — Progress Notes (Signed)
Pap due per ob/gyn

## 2019-09-07 ENCOUNTER — Encounter: Payer: Self-pay | Admitting: Internal Medicine

## 2019-09-25 LAB — COMPREHENSIVE METABOLIC PANEL
ALT: 12 IU/L (ref 0–32)
AST: 14 IU/L (ref 0–40)
Albumin/Globulin Ratio: 2.1 (ref 1.2–2.2)
Albumin: 4.9 g/dL — ABNORMAL HIGH (ref 3.8–4.8)
Alkaline Phosphatase: 106 IU/L (ref 48–121)
BUN/Creatinine Ratio: 19 (ref 9–23)
BUN: 14 mg/dL (ref 6–20)
Bilirubin Total: 0.2 mg/dL (ref 0.0–1.2)
CO2: 25 mmol/L (ref 20–29)
Calcium: 9.4 mg/dL (ref 8.7–10.2)
Chloride: 100 mmol/L (ref 96–106)
Creatinine, Ser: 0.75 mg/dL (ref 0.57–1.00)
GFR calc Af Amer: 121 mL/min/{1.73_m2} (ref >59)
GFR calc non Af Amer: 105 mL/min/{1.73_m2} (ref >59)
Globulin, Total: 2.3 g/dL (ref 1.5–4.5)
Glucose: 76 mg/dL (ref 65–99)
Potassium: 4.7 mmol/L (ref 3.5–5.2)
Sodium: 138 mmol/L (ref 134–144)
Total Protein: 7.2 g/dL (ref 6.0–8.5)

## 2019-09-25 LAB — IRON,TIBC AND FERRITIN PANEL
Ferritin: 62 ng/mL (ref 15–150)
Iron Saturation: 19 % (ref 15–55)
Iron: 66 ug/dL (ref 27–159)
Total Iron Binding Capacity: 345 ug/dL (ref 250–450)
UIBC: 279 ug/dL (ref 131–425)

## 2019-09-25 LAB — CBC WITH DIFFERENTIAL/PLATELET
Basophils Absolute: 0 10*3/uL (ref 0.0–0.2)
Basos: 1 %
EOS (ABSOLUTE): 0.1 10*3/uL (ref 0.0–0.4)
Eos: 1 %
Hematocrit: 39.8 % (ref 34.0–46.6)
Hemoglobin: 13.4 g/dL (ref 11.1–15.9)
Immature Grans (Abs): 0 10*3/uL (ref 0.0–0.1)
Immature Granulocytes: 0 %
Lymphocytes Absolute: 2.3 10*3/uL (ref 0.7–3.1)
Lymphs: 36 %
MCH: 30.7 pg (ref 26.6–33.0)
MCHC: 33.7 g/dL (ref 31.5–35.7)
MCV: 91 fL (ref 79–97)
Monocytes Absolute: 0.4 10*3/uL (ref 0.1–0.9)
Monocytes: 6 %
Neutrophils Absolute: 3.6 10*3/uL (ref 1.4–7.0)
Neutrophils: 56 %
Platelets: 208 10*3/uL (ref 150–450)
RBC: 4.36 x10E6/uL (ref 3.77–5.28)
RDW: 12.7 % (ref 11.7–15.4)
WBC: 6.3 10*3/uL (ref 3.4–10.8)

## 2019-09-25 LAB — URINALYSIS, ROUTINE W REFLEX MICROSCOPIC
Bilirubin, UA: NEGATIVE
Glucose, UA: NEGATIVE
Ketones, UA: NEGATIVE
Leukocytes,UA: NEGATIVE
Nitrite, UA: NEGATIVE
Protein,UA: NEGATIVE
RBC, UA: NEGATIVE
Specific Gravity, UA: 1.006 (ref 1.005–1.030)
Urobilinogen, Ur: 0.2 mg/dL (ref 0.2–1.0)
pH, UA: 6.5 (ref 5.0–7.5)

## 2019-09-25 LAB — LIPID PANEL
Chol/HDL Ratio: 2.6 ratio (ref 0.0–4.4)
Cholesterol, Total: 209 mg/dL — ABNORMAL HIGH (ref 100–199)
HDL: 80 mg/dL (ref >39)
LDL Chol Calc (NIH): 119 mg/dL — ABNORMAL HIGH (ref 0–99)
Triglycerides: 57 mg/dL (ref 0–149)
VLDL Cholesterol Cal: 10 mg/dL (ref 5–40)

## 2019-09-25 LAB — HEMOGLOBIN A1C
Est. average glucose Bld gHb Est-mCnc: 103 mg/dL
Hgb A1c MFr Bld: 5.2 % (ref 4.8–5.6)

## 2019-09-25 LAB — HEPATITIS B SURFACE ANTIBODY, QUANTITATIVE: Hepatitis B Surf Ab Quant: 64.4 m[IU]/mL (ref >9.9)

## 2019-09-25 LAB — TSH: TSH: 1.2 u[IU]/mL (ref 0.450–4.500)

## 2019-09-25 LAB — VITAMIN D 25 HYDROXY (VIT D DEFICIENCY, FRACTURES): Vit D, 25-Hydroxy: 23.6 ng/mL — ABNORMAL LOW (ref 30.0–100.0)

## 2020-05-19 IMAGING — US US MFM OB COMP +14 WKS
1 series · 13 of 28 positions shown · non-contrast
Comparison: none

[Series 1: us mfm ob comp +14 wks · 84 acquisitions, 13 frames shown]
[im 4/84]
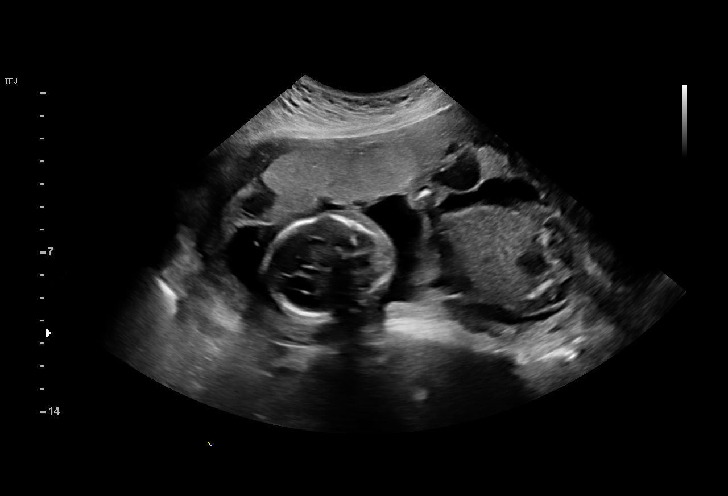
[im 10/84]
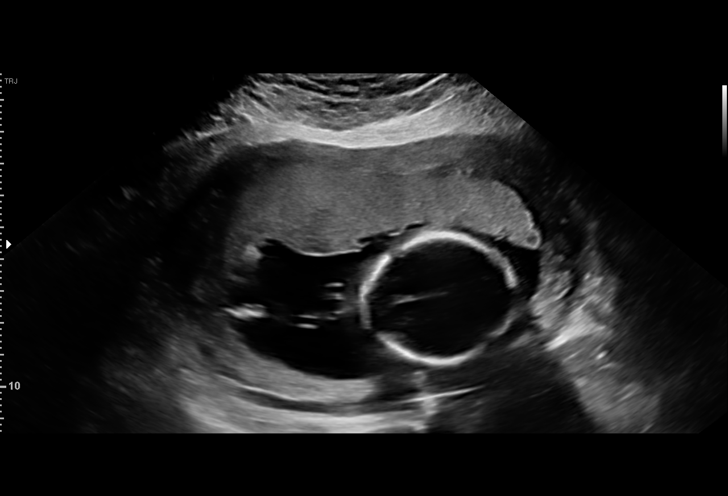
[im 16/84]
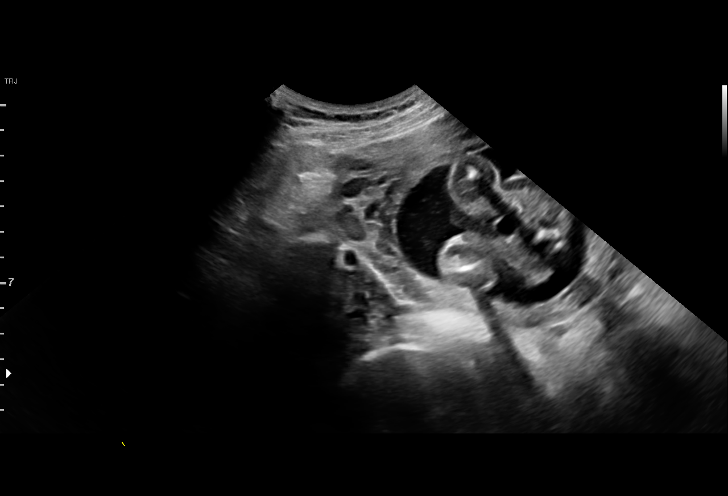
[im 22/84]
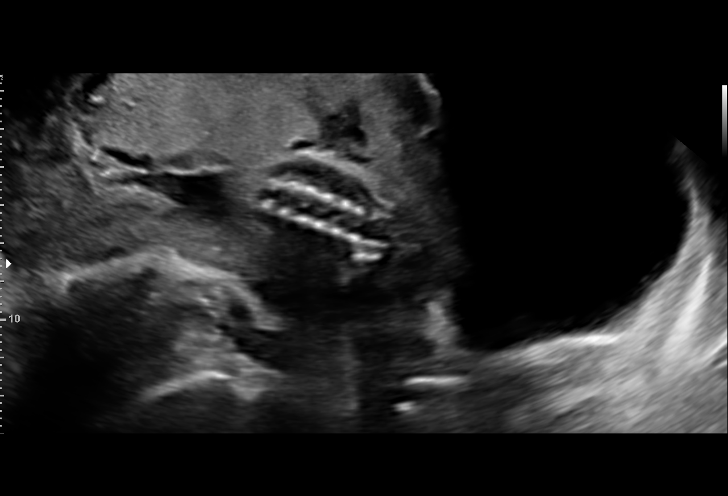
[im 28/84]
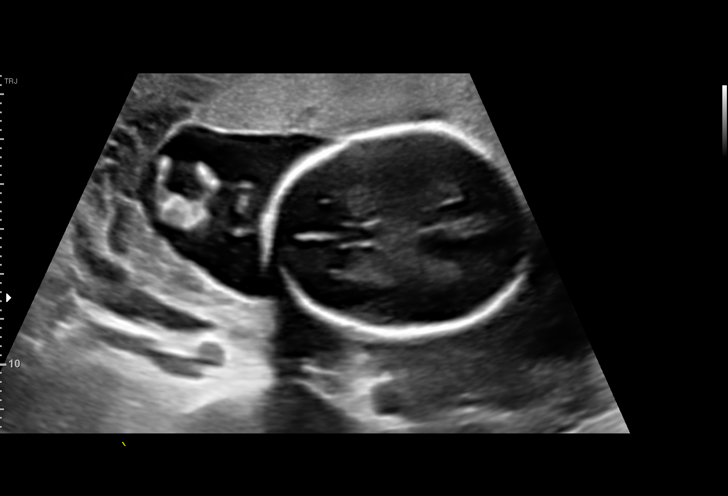
[im 34/84]
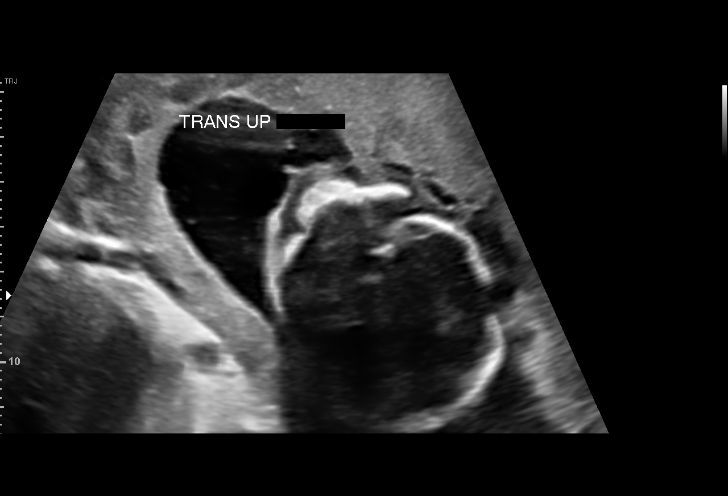
[im 44/84]
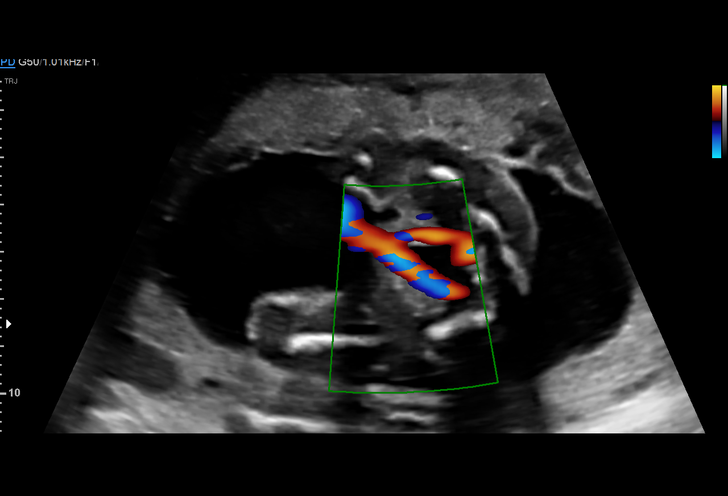
[im 50/84]
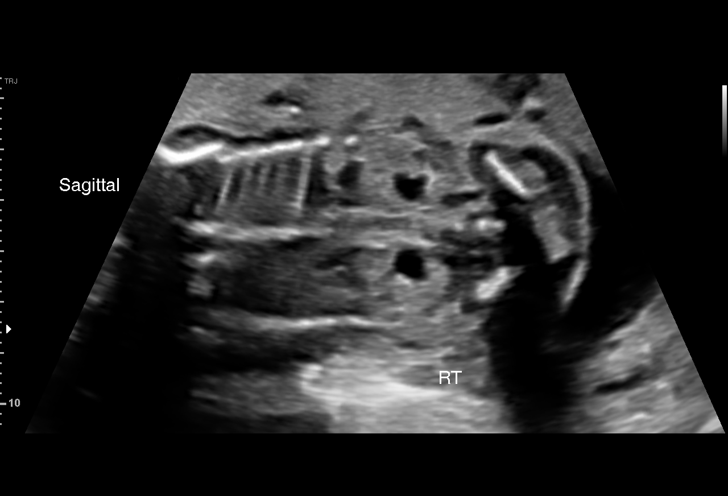
[im 56/84]
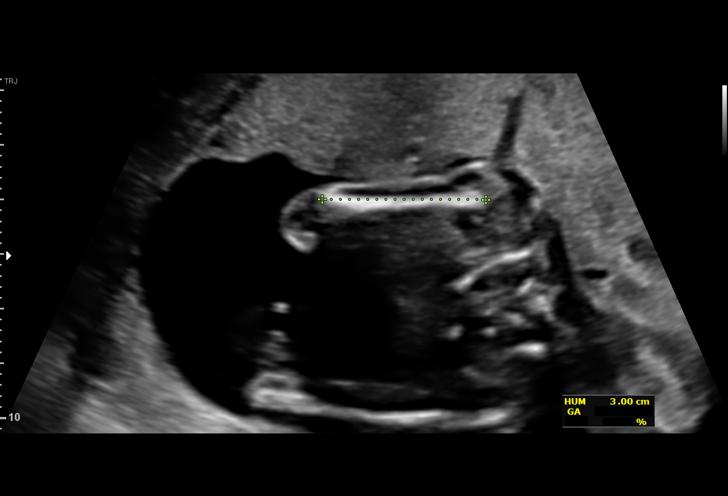
[im 62/84]
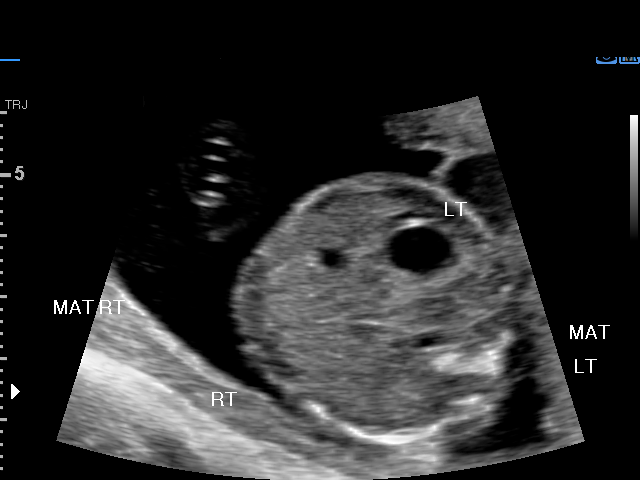
[im 68/84]
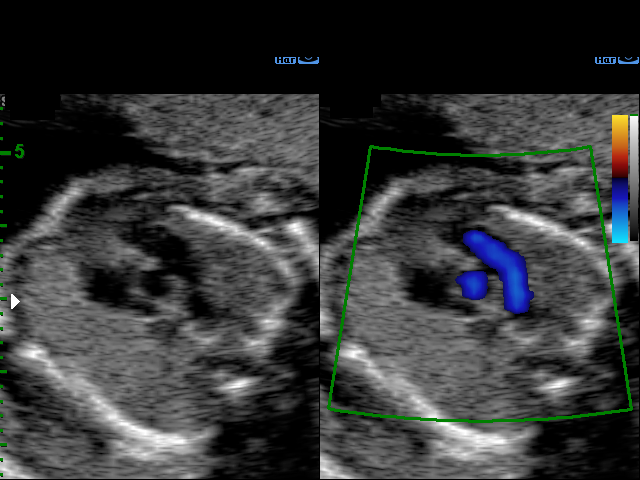
[im 74/84]
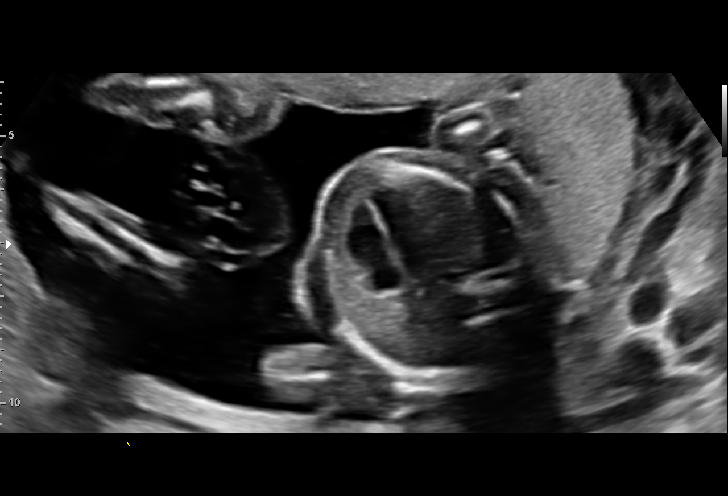
[im 80/84]
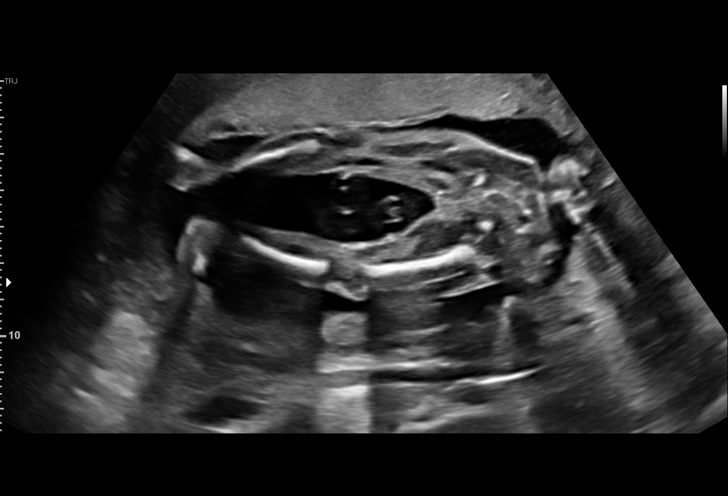

[13 of 28 positions shown; findings below may reference images not displayed]

1  US MFM OB COMP + 14 WK               76805.01     MAJHAR HARES
 ----------------------------------------------------------------------

 ----------------------------------------------------------------------
Indications

  Encounter for antenatal screening for
  malformations
  19 weeks gestation of pregnancy
 ----------------------------------------------------------------------
Fetal Evaluation

 Num Of Fetuses:         1
 Fetal Heart Rate(bpm):  149
 Cardiac Activity:       Observed
 Presentation:           Breech
 Placenta:               Anterior
 P. Cord Insertion:      Visualized

 Amniotic Fluid
 AFI FV:      Within normal limits

                             Largest Pocket(cm)

Biometry

 BPD:      45.2  mm     G. Age:  19w 5d         42  %    CI:        69.68   %    70 - 86
                                                         FL/HC:      17.2   %    16.8 -
 HC:      172.8  mm     G. Age:  19w 6d         41  %    HC/AC:      1.13        1.09 -
 AC:      152.6  mm     G. Age:  20w 3d         65  %    FL/BPD:     65.7   %
 FL:       29.7  mm     G. Age:  19w 1d         19  %    FL/AC:      19.5   %    20 - 24
 HUM:      30.5  mm     G. Age:  20w 1d         59  %
 CER:      20.4  mm     G. Age:  19w 3d         40  %
 NFT:       3.6  mm
 LV:        7.6  mm
 CM:        3.1  mm
 Est. FW:     317  gm    0 lb 11 oz      45  %
OB History

 Gravidity:    2         Term:   0        Prem:   0        SAB:   1
 TOP:          0       Ectopic:  0        Living: 0
Gestational Age

 LMP:           19w 6d        Date:  07/12/18                 EDD:   04/18/19
 U/S Today:     19w 6d                                        EDD:   04/18/19
 Best:          19w 6d     Det. By:  LMP  (07/12/18)          EDD:   04/18/19
Anatomy

 Cranium:               Appears normal         LVOT:                   Appears normal
 Cavum:                 Appears normal         Aortic Arch:            Not well visualized
 Ventricles:            Appears normal         Ductal Arch:            Not well visualized
 Choroid Plexus:        Appears normal         Diaphragm:              Appears normal
 Cerebellum:            Appears normal         Stomach:                Appears normal, left
                                                                       sided
 Posterior Fossa:       Appears normal         Abdomen:                Appears normal
 Nuchal Fold:           Appears normal         Abdominal Wall:         Appears nml (cord
                                                                       insert, abd wall)
 Face:                  Appears normal         Cord Vessels:           Appears normal (3
                        (orbits and profile)                           vessel cord)
 Lips:                  Appears normal         Kidneys:                Bilat UTD, Rt 6mm,
                                                                       Lt 5mm
 Palate:                Appears normal         Bladder:                Appears normal
 Thoracic:              Appears normal         Spine:                  Appears normal
 Heart:                 Appears normal         Upper Extremities:      Appears normal
                        (4CH, axis, and
                        situs)
 RVOT:                  Appears normal         Lower Extremities:      Appears normal

 Other:  Nasal bone visualized. Fetus appears to be female. Open hands
         visualized. 5th digit visualized. Heels visualized.
Cervix Uterus Adnexa

 Cervix
 Length:           6.02  cm.
 Normal appearance by transabdominal scan.

 Left Ovary
 Within normal limits.

 Right Ovary
 Not visualized. No adnexal mass visualized.
Impression

 Ms. Polin Billiot2, P0 is here for fetal anatomy scan.  On cell
 free fetal DNA screening the risks of fetal aneuploidies are
 not increased.  Patient reports no chronic medical conditions.

 We performed fetal anatomical survey.  Fetal biometry is
 consistent with her previously established dates.  Amniotic
 fluid is normal and good fetal activity seen.  Bilateral mild
 urinary tract dilations are seen (measuring 5 to 6 mm each).
 No other markers of aneuploidies or fetal structural defects
 are seen.

 I explained the finding of UTD that most cases resolve on
 your low or after birth and only rarely associated with
 obstructive uropathy.  UTD is also a marker for Down
 syndrome.  Given that she had low risk for Down syndrome
 on cell free fetal DNA screening this should be considered a
 normal variant.
 I discussed the significance and limitations of cell free fetal
 DNA screening and informed her that only amniocentesis will
 give a definitive result on the fetal karyotype.

 Patient understands the limitations of ultrasound in detecting
 fetal anomalies and opted not to have amniocentesis.
Recommendations

 -An appointment was made for her to return in 8 weeks for
 fetal growth and renal assessments.
                 Fu, Abriana

## 2020-08-31 ENCOUNTER — Encounter: Payer: No Typology Code available for payment source | Admitting: Internal Medicine

## 2020-12-06 ENCOUNTER — Telehealth: Payer: Self-pay

## 2020-12-06 NOTE — Telephone Encounter (Signed)
Patient is calling in to reschedule and stated that she has seen Dr.Tracy before but patient has not established and had a appointment in June 2021 that was cancelled.Informed pt she has not been seen and no other providers are accepting patients.Please advise.

## 2020-12-08 ENCOUNTER — Encounter: Payer: Self-pay | Admitting: Internal Medicine

## 2021-01-06 ENCOUNTER — Encounter: Payer: No Typology Code available for payment source | Admitting: Family

## 2021-02-14 ENCOUNTER — Ambulatory Visit: Payer: No Typology Code available for payment source | Admitting: Obstetrics & Gynecology

## 2021-02-21 ENCOUNTER — Telehealth: Payer: Self-pay | Admitting: Internal Medicine

## 2021-02-21 ENCOUNTER — Encounter: Payer: No Typology Code available for payment source | Admitting: Internal Medicine

## 2021-02-21 ENCOUNTER — Encounter: Payer: Self-pay | Admitting: Obstetrics & Gynecology

## 2021-02-21 ENCOUNTER — Other Ambulatory Visit (HOSPITAL_COMMUNITY)
Admission: RE | Admit: 2021-02-21 | Discharge: 2021-02-21 | Disposition: A | Discharge status: Home / Self Care | Payer: No Typology Code available for payment source | Source: Ambulatory Visit | Attending: Obstetrics & Gynecology | Admitting: Obstetrics & Gynecology

## 2021-02-21 ENCOUNTER — Other Ambulatory Visit: Payer: Self-pay

## 2021-02-21 ENCOUNTER — Ambulatory Visit (INDEPENDENT_AMBULATORY_CARE_PROVIDER_SITE_OTHER): Payer: No Typology Code available for payment source | Admitting: Obstetrics & Gynecology

## 2021-02-21 VITALS — BP 116/77 | HR 77 | Ht 65.0 in | Wt 133.0 lb

## 2021-02-21 DIAGNOSIS — Z01419 Encounter for gynecological examination (general) (routine) without abnormal findings: Secondary | ICD-10-CM

## 2021-02-21 NOTE — Progress Notes (Signed)
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  Current complaints: none.   Denies abnormal vaginal bleeding, discharge, pelvic pain, problems with intercourse or other gynecologic concerns.    Gynecologic History Patient's last menstrual period was 02/08/2021 (approximate). Contraception: coitus interruptus Last Pap: June 2018 at outside scan. Result was normal with negative HPV   Obstetric History OB History  Gravida Para Term Preterm AB Living  2 1 1  0 1 1  SAB IAB Ectopic Multiple Live Births  1 0 0   1    # Outcome Date GA Lbr Len/2nd Weight Sex Delivery Anes PTL Lv  2 Term 04/03/19 [redacted]w[redacted]d 15:20 / 01:25 5 lb 14 oz (2.665 kg) F Vag-Spont EPI  LIV     Birth Comments: WNL  1 SAB             Past Medical History:  Diagnosis Date   Gestational diabetes    Gestational hypertension 04/04/2019   Hemorrhoids    Mastitis    Vitamin D deficiency     Past Surgical History:  Procedure Laterality Date   crown     ROOT CANAL     WISDOM TOOTH EXTRACTION      No current outpatient medications on file prior to visit.   No current facility-administered medications on file prior to visit.    No Known Allergies  Social History:  reports that she has never smoked. She has never used smokeless tobacco. She reports that she does not drink alcohol and does not use drugs.  Family History  Problem Relation Age of Onset   Hypertension Mother    Hypertension Father    Hyperlipidemia Father    Endometriosis Sister     The following portions of the patient's history were reviewed and updated as appropriate: allergies, current medications, past family history, past medical history, past social history, past surgical history and problem list.  Review of Systems Pertinent items noted in HPI and remainder of comprehensive ROS otherwise negative.  Physical Exam:  BP  116/77   Pulse 77   Ht 5\' 5"  (1.651 m)   Wt 133 lb (60.3 kg)   LMP 02/08/2021 (Approximate)   BMI 22.13 kg/m  CONSTITUTIONAL: Well-developed, well-nourished female in no acute distress.  HENT:  Normocephalic, atraumatic, External right and left ear normal.  EYES: Conjunctivae and EOM are normal. Pupils are equal, round, and reactive to light. No scleral icterus.  NECK: Normal range of motion, supple, no masses.  Normal thyroid.  SKIN: Skin is warm and dry. No rash noted. Not diaphoretic. No erythema. No pallor. MUSCULOSKELETAL: Normal range of motion. No tenderness.  No cyanosis, clubbing, or edema. NEUROLOGIC: Alert and oriented to person, place, and time. Normal reflexes, muscle tone coordination.  PSYCHIATRIC: Normal mood and affect. Normal behavior. Normal judgment and thought content. CARDIOVASCULAR: Normal heart rate noted, regular rhythm RESPIRATORY: Clear to auscultation bilaterally. Effort and breath sounds normal, no problems with respiration noted. BREASTS: Symmetric in size. No masses, tenderness, skin changes, nipple drainage, or lymphadenopathy bilaterally. Performed in the presence of a chaperone. ABDOMEN: Soft, no distention noted.  No tenderness, rebound or guarding.  PELVIC: Normal appearing external genitalia and urethral meatus; normal appearing vaginal mucosa and multiparous cervix.  No abnormal vaginal discharge noted.  Pap smear obtained.  Normal uterine size, no other palpable masses, no uterine or adnexal tenderness.  Performed in  the presence of a chaperone.   Assessment and Plan:     1. Well woman exam with routine gynecological exam - Cytology - PAP Will follow up results of pap smear and manage accordingly. Normal breast examination. Routine preventative health maintenance measures emphasized, will have appointment with PCP soon. Please refer to After Visit Summary for other counseling recommendations.      Verita Schneiders, MD, [place] for [employer], [place]

## 2021-02-22 ENCOUNTER — Ambulatory Visit (INDEPENDENT_AMBULATORY_CARE_PROVIDER_SITE_OTHER): Payer: No Typology Code available for payment source | Admitting: Internal Medicine

## 2021-02-22 ENCOUNTER — Other Ambulatory Visit: Payer: Self-pay

## 2021-02-22 ENCOUNTER — Encounter: Payer: No Typology Code available for payment source | Admitting: Internal Medicine

## 2021-02-22 ENCOUNTER — Encounter: Payer: Self-pay | Admitting: Internal Medicine

## 2021-02-22 VITALS — BP 108/74 | HR 74 | Temp 97.8°F | Ht 65.0 in | Wt 133.0 lb

## 2021-02-22 DIAGNOSIS — Z1329 Encounter for screening for other suspected endocrine disorder: Secondary | ICD-10-CM | POA: Diagnosis not present

## 2021-02-22 DIAGNOSIS — Z0000 Encounter for general adult medical examination without abnormal findings: Secondary | ICD-10-CM | POA: Diagnosis not present

## 2021-02-22 DIAGNOSIS — E611 Iron deficiency: Secondary | ICD-10-CM

## 2021-02-22 DIAGNOSIS — E559 Vitamin D deficiency, unspecified: Secondary | ICD-10-CM | POA: Diagnosis not present

## 2021-02-22 DIAGNOSIS — E785 Hyperlipidemia, unspecified: Secondary | ICD-10-CM

## 2021-02-22 DIAGNOSIS — O24419 Gestational diabetes mellitus in pregnancy, unspecified control: Secondary | ICD-10-CM

## 2021-02-22 DIAGNOSIS — Z1389 Encounter for screening for other disorder: Secondary | ICD-10-CM

## 2021-02-22 MED ORDER — CHOLECALCIFEROL 1.25 MG (50000 UT) PO CAPS
50000.0000 [IU] | ORAL_CAPSULE | ORAL | 1 refills | Status: DC
Start: 2021-02-22 — End: 2021-03-07
  Filled 2021-02-22: qty 13, 365d supply, fill #0

## 2021-02-22 NOTE — Progress Notes (Signed)
Chief Complaint  Patient presents with   Annual Exam   Annual Doing well no complaints    Review of Systems  Constitutional:  Negative for weight loss.  HENT:  Negative for hearing loss.   Eyes:  Negative for blurred vision.  Respiratory:  Negative for shortness of breath.   Cardiovascular:  Negative for chest pain.  Gastrointestinal:  Negative for abdominal pain and blood in stool.  Genitourinary:  Negative for dysuria.  Musculoskeletal:  Negative for falls and joint pain.  Skin:  Negative for rash.  Neurological:  Negative for headaches.  Psychiatric/Behavioral:  Negative for depression.   Past Medical History:  Diagnosis Date   Gestational diabetes    Gestational hypertension 04/04/2019   Hemorrhoids    Mastitis    Vitamin D deficiency    Past Surgical History:  Procedure Laterality Date   crown     ROOT CANAL     WISDOM TOOTH EXTRACTION     Family History  Problem Relation Age of Onset   Hypertension Mother    Hypertension Father    Hyperlipidemia Father    Endometriosis Sister    Social History   Socioeconomic History   Marital status: Unknown    Spouse name: Soleia Badolato   Number of children: 1   Years of education: Not on file   Highest education level: Professional school degree (e.g., MD, DDS, DVM, JD)  Occupational History   Occupation: Naval architect: [employer]    Comment: [employer]  Tobacco Use   Smoking status: Never   Smokeless tobacco: Never  Vaping Use   Vaping Use: Never used  Substance and Sexual Activity   Alcohol use: Never   Drug use: Never   Sexual activity: Yes    Birth control/protection: None  Other Topics Concern   Not on file  Social History Narrative   ** Merged History Encounter **       Patient is a GI physician at [employer] Lives with husband and 08/20/19 24 month old daughter Collier Bullock up in [place] 2 siblings    Social Determinants of Health   Financial Resource Strain: Not on file  Food  Insecurity: Not on file  Transportation Needs: Not on file  Physical Activity: Not on file  Stress: Not on file  Social Connections: Not on file  Intimate Partner Violence: Not on file   Current Meds  Medication Sig   Cholecalciferol 1.25 MG (50000 UT) capsule Take 1 capsule by mouth once a month   No Known Allergies No results found for this or any previous visit (from the past 2160 hour(s)). Objective  Body mass index is 22.13 kg/m. Wt Readings from Last 3 Encounters:  02/22/21 133 lb (60.3 kg)  02/21/21 133 lb (60.3 kg)  08/20/19 127 lb 6.4 oz (57.8 kg)   Temp Readings from Last 3 Encounters:  02/22/21 97.8 F (36.6 C) (Temporal)  08/20/19 97.7 F (36.5 C) (Temporal)  04/05/19 98.4 F (36.9 C) (Oral)   BP Readings from Last 3 Encounters:  02/22/21 108/74  02/21/21 116/77  08/20/19 120/80   Pulse Readings from Last 3 Encounters:  02/22/21 74  02/21/21 77  08/20/19 71    Physical Exam Vitals and nursing note reviewed.  Constitutional:      Appearance: Normal appearance. She is well-developed and well-groomed.  HENT:     Head: Normocephalic and atraumatic.  Eyes:     Conjunctiva/sclera: Conjunctivae normal.     Pupils: Pupils are equal,  round, and reactive to light.  Cardiovascular:     Rate and Rhythm: Normal rate and regular rhythm.     Heart sounds: Normal heart sounds. No murmur heard. Pulmonary:     Effort: Pulmonary effort is normal.     Breath sounds: Normal breath sounds.  Abdominal:     General: Abdomen is flat. Bowel sounds are normal.     Tenderness: There is no abdominal tenderness.  Musculoskeletal:        General: No tenderness.  Skin:    General: Skin is warm and dry.  Neurological:     General: No focal deficit present.     Mental Status: She is alert and oriented to person, place, and time. Mental status is at baseline.     Cranial Nerves: Cranial nerves 2-12 are intact.     Gait: Gait is intact.  Psychiatric:        Attention and  Perception: Attention and perception normal.        Mood and Affect: Mood and affect normal.        Speech: Speech normal.        Behavior: Behavior normal. Behavior is cooperative.        Thought Content: Thought content normal.        Cognition and Memory: Cognition and memory normal.        Judgment: Judgment normal.    Assessment  Plan  Annual physical exam - Plan: Comprehensive metabolic panel, Lipid panel, CBC with Differential/Platelet, TSH, Urinalysis, Routine w reflex microscopic, Hemoglobin A1c, Iron, TIBC and Ferritin Panel, Vitamin D (25 hydroxy). A1C, iron def See below  Vitamin D deficiency - Plan: Cholecalciferol 1.25 MG (50000 UT) capsule, Vitamin D (25 hydroxy) Q month    Flu shot utd 2022  Tdap utd  covid 2/2 ? 3 will check card  Hep B immune  Hep C negative in the past will check records and let me know  Consider HPV vaccine if not had  02/21/21 pap when due no h/o abnormal disc when due with ob/gyn  mammo age 72  Colonoscopy 45  Skin normal  rec healthy diet and exercise   Provider: Dr. Olivia Mackie McLean-Scocuzza-Internal Medicine

## 2021-02-22 NOTE — Patient Instructions (Addendum)
Consider pfizer 4 total doses  [facility], [facility], [facility], [facility], [facility]  Check date of 3rd dose    Hep C check status in the past and date and let me know

## 2021-02-27 ENCOUNTER — Encounter: Payer: Self-pay | Admitting: Obstetrics & Gynecology

## 2021-02-27 DIAGNOSIS — R87610 Atypical squamous cells of undetermined significance on cytologic smear of cervix (ASC-US): Secondary | ICD-10-CM | POA: Insufficient documentation

## 2021-02-27 LAB — CYTOLOGY - PAP
Comment: NEGATIVE
Diagnosis: UNDETERMINED — AB
High risk HPV: NEGATIVE

## 2021-03-02 ENCOUNTER — Encounter: Payer: No Typology Code available for payment source | Admitting: Internal Medicine

## 2021-03-03 ENCOUNTER — Other Ambulatory Visit: Payer: Self-pay

## 2021-03-06 NOTE — Addendum Note (Signed)
Addended by: Thressa Sheller on: 03/06/2021 10:09 AM   Modules accepted: Orders

## 2021-03-07 ENCOUNTER — Other Ambulatory Visit: Payer: Self-pay | Admitting: Internal Medicine

## 2021-03-07 ENCOUNTER — Encounter: Payer: No Typology Code available for payment source | Admitting: Internal Medicine

## 2021-03-07 ENCOUNTER — Other Ambulatory Visit: Payer: Self-pay

## 2021-03-07 DIAGNOSIS — E559 Vitamin D deficiency, unspecified: Secondary | ICD-10-CM

## 2021-03-07 LAB — LIPID PANEL
Chol/HDL Ratio: 3.1 ratio (ref 0.0–4.4)
Cholesterol, Total: 200 mg/dL — ABNORMAL HIGH (ref 100–199)
HDL: 64 mg/dL (ref >39)
LDL Chol Calc (NIH): 117 mg/dL — ABNORMAL HIGH (ref 0–99)
Triglycerides: 106 mg/dL (ref 0–149)
VLDL Cholesterol Cal: 19 mg/dL (ref 5–40)

## 2021-03-07 LAB — TSH: TSH: 1.39 u[IU]/mL (ref 0.450–4.500)

## 2021-03-07 LAB — CBC WITH DIFFERENTIAL/PLATELET
Basophils Absolute: 0 10*3/uL (ref 0.0–0.2)
Basos: 0 %
EOS (ABSOLUTE): 0.1 10*3/uL (ref 0.0–0.4)
Eos: 1 %
Hematocrit: 42.2 % (ref 34.0–46.6)
Hemoglobin: 14 g/dL (ref 11.1–15.9)
Immature Grans (Abs): 0 10*3/uL (ref 0.0–0.1)
Immature Granulocytes: 0 %
Lymphocytes Absolute: 2.2 10*3/uL (ref 0.7–3.1)
Lymphs: 38 %
MCH: 30.4 pg (ref 26.6–33.0)
MCHC: 33.2 g/dL (ref 31.5–35.7)
MCV: 92 fL (ref 79–97)
Monocytes Absolute: 0.3 10*3/uL (ref 0.1–0.9)
Monocytes: 5 %
Neutrophils Absolute: 3.2 10*3/uL (ref 1.4–7.0)
Neutrophils: 56 %
Platelets: 216 10*3/uL (ref 150–450)
RBC: 4.6 x10E6/uL (ref 3.77–5.28)
RDW: 12.4 % (ref 11.7–15.4)
WBC: 5.8 10*3/uL (ref 3.4–10.8)

## 2021-03-07 LAB — URINALYSIS, ROUTINE W REFLEX MICROSCOPIC
Bilirubin, UA: NEGATIVE
Glucose, UA: NEGATIVE
Ketones, UA: NEGATIVE
Leukocytes,UA: NEGATIVE
Nitrite, UA: NEGATIVE
Protein,UA: NEGATIVE
RBC, UA: NEGATIVE
Specific Gravity, UA: 1.008 (ref 1.005–1.030)
Urobilinogen, Ur: 0.2 mg/dL (ref 0.2–1.0)
pH, UA: 7 (ref 5.0–7.5)

## 2021-03-07 LAB — COMPREHENSIVE METABOLIC PANEL
ALT: 12 IU/L (ref 0–32)
AST: 14 IU/L (ref 0–40)
Albumin/Globulin Ratio: 2.3 — ABNORMAL HIGH (ref 1.2–2.2)
Albumin: 5.4 g/dL — ABNORMAL HIGH (ref 3.8–4.8)
Alkaline Phosphatase: 40 IU/L — ABNORMAL LOW (ref 44–121)
BUN/Creatinine Ratio: 11 (ref 9–23)
BUN: 9 mg/dL (ref 6–20)
Bilirubin Total: 0.4 mg/dL (ref 0.0–1.2)
CO2: 23 mmol/L (ref 20–29)
Calcium: 9.7 mg/dL (ref 8.7–10.2)
Chloride: 99 mmol/L (ref 96–106)
Creatinine, Ser: 0.81 mg/dL (ref 0.57–1.00)
Globulin, Total: 2.4 g/dL (ref 1.5–4.5)
Glucose: 89 mg/dL (ref 70–99)
Potassium: 4.6 mmol/L (ref 3.5–5.2)
Sodium: 135 mmol/L (ref 134–144)
Total Protein: 7.8 g/dL (ref 6.0–8.5)
eGFR: 97 mL/min/{1.73_m2} (ref >59)

## 2021-03-07 LAB — IRON,TIBC AND FERRITIN PANEL
Ferritin: 50 ng/mL (ref 15–150)
Iron Saturation: 31 % (ref 15–55)
Iron: 119 ug/dL (ref 27–159)
Total Iron Binding Capacity: 388 ug/dL (ref 250–450)
UIBC: 269 ug/dL (ref 131–425)

## 2021-03-07 LAB — HEMOGLOBIN A1C
Est. average glucose Bld gHb Est-mCnc: 97 mg/dL
Hgb A1c MFr Bld: 5 % (ref 4.8–5.6)

## 2021-03-07 LAB — VITAMIN D 25 HYDROXY (VIT D DEFICIENCY, FRACTURES): Vit D, 25-Hydroxy: 18.3 ng/mL — ABNORMAL LOW (ref 30.0–100.0)

## 2021-03-07 MED ORDER — CHOLECALCIFEROL 1.25 MG (50000 UT) PO CAPS
50000.0000 [IU] | ORAL_CAPSULE | ORAL | 2 refills | Status: AC
  Filled 2021-03-07: qty 13, 91d supply, fill #0

## 2021-04-04 NOTE — Telephone Encounter (Signed)
error
